# Patient Record
Sex: Female | Born: 1985 | Hispanic: Yes | Marital: Married | Smoking: Never smoker
Health system: Southern US, Community
[De-identification: ages and names within clinical notes are randomized; demographics above are authoritative.]

## PROBLEM LIST (undated history)

## (undated) DIAGNOSIS — C801 Malignant (primary) neoplasm, unspecified: Secondary | ICD-10-CM

## (undated) DIAGNOSIS — D649 Anemia, unspecified: Secondary | ICD-10-CM

## (undated) DIAGNOSIS — J45909 Unspecified asthma, uncomplicated: Secondary | ICD-10-CM

## (undated) HISTORY — DX: Unspecified asthma, uncomplicated: J45.909

## (undated) HISTORY — PX: THYROIDECTOMY: SHX17

## (undated) SURGERY — Surgical Case
Anesthesia: *Unknown

---

## 2015-11-13 ENCOUNTER — Emergency Department (HOSPITAL_COMMUNITY)
Admission: EM | Admit: 2015-11-13 | Discharge: 2015-11-13 | Disposition: A | Discharge status: Home / Self Care | Payer: Self-pay | Attending: Emergency Medicine | Admitting: Emergency Medicine

## 2015-11-13 ENCOUNTER — Encounter (HOSPITAL_COMMUNITY): Payer: Self-pay | Admitting: Emergency Medicine

## 2015-11-13 DIAGNOSIS — Z3202 Encounter for pregnancy test, result negative: Secondary | ICD-10-CM | POA: Insufficient documentation

## 2015-11-13 DIAGNOSIS — N730 Acute parametritis and pelvic cellulitis: Secondary | ICD-10-CM | POA: Insufficient documentation

## 2015-11-13 LAB — URINALYSIS, ROUTINE W REFLEX MICROSCOPIC
BILIRUBIN URINE: NEGATIVE
Glucose, UA: NEGATIVE mg/dL
Hgb urine dipstick: NEGATIVE
KETONES UR: NEGATIVE mg/dL
Leukocytes, UA: NEGATIVE
NITRITE: NEGATIVE
PH: 6.5 (ref 5.0–8.0)
PROTEIN: NEGATIVE mg/dL
Specific Gravity, Urine: 1.027 (ref 1.005–1.030)

## 2015-11-13 LAB — COMPREHENSIVE METABOLIC PANEL
ALK PHOS: 52 U/L (ref 38–126)
ALT: 26 U/L (ref 14–54)
AST: 23 U/L (ref 15–41)
Albumin: 3.9 g/dL (ref 3.5–5.0)
Anion gap: 10 (ref 5–15)
BILIRUBIN TOTAL: 0.5 mg/dL (ref 0.3–1.2)
BUN: 8 mg/dL (ref 6–20)
CALCIUM: 9.3 mg/dL (ref 8.9–10.3)
CO2: 22 mmol/L (ref 22–32)
Chloride: 107 mmol/L (ref 101–111)
Creatinine, Ser: 0.77 mg/dL (ref 0.44–1.00)
GFR calc Af Amer: >60 mL/min (ref >60)
Glucose, Bld: 94 mg/dL (ref 65–99)
POTASSIUM: 4.4 mmol/L (ref 3.5–5.1)
Sodium: 139 mmol/L (ref 135–145)
TOTAL PROTEIN: 7.4 g/dL (ref 6.5–8.1)

## 2015-11-13 LAB — CBC
HEMATOCRIT: 41.8 % (ref 36.0–46.0)
Hemoglobin: 13.6 g/dL (ref 12.0–15.0)
MCH: 29.2 pg (ref 26.0–34.0)
MCHC: 32.5 g/dL (ref 30.0–36.0)
MCV: 89.9 fL (ref 78.0–100.0)
PLATELETS: 240 10*3/uL (ref 150–400)
RBC: 4.65 MIL/uL (ref 3.87–5.11)
RDW: 13.1 % (ref 11.5–15.5)
WBC: 5.3 10*3/uL (ref 4.0–10.5)

## 2015-11-13 LAB — I-STAT TROPONIN, ED: TROPONIN I, POC: 0 ng/mL (ref 0.00–0.08)

## 2015-11-13 LAB — WET PREP, GENITAL
Sperm: NONE SEEN
Trich, Wet Prep: NONE SEEN
YEAST WET PREP: NONE SEEN

## 2015-11-13 LAB — LIPASE, BLOOD: Lipase: 23 U/L (ref 11–51)

## 2015-11-13 LAB — I-STAT BETA HCG BLOOD, ED (MC, WL, AP ONLY): I-stat hCG, quantitative: <5 m[IU]/mL (ref <5)

## 2015-11-13 MED ORDER — DOXYCYCLINE HYCLATE 50 MG PO CAPS: 50.0000 mg | ORAL_CAPSULE | Freq: Two times a day (BID) | ORAL | Status: DC

## 2015-11-13 MED ORDER — IBUPROFEN 400 MG PO TABS: 400.0000 mg | ORAL_TABLET | Freq: Four times a day (QID) | ORAL | Status: DC | PRN

## 2015-11-13 MED ORDER — CEFTRIAXONE SODIUM 250 MG IJ SOLR
250.0000 mg | Freq: Once | INTRAMUSCULAR | Status: AC
  Administered 2015-11-13: 250 mg via INTRAMUSCULAR
  Filled 2015-11-13: qty 250

## 2015-11-13 MED ORDER — AZITHROMYCIN 250 MG PO TABS
1000.0000 mg | ORAL_TABLET | Freq: Once | ORAL | Status: AC
  Administered 2015-11-13: 1000 mg via ORAL
  Filled 2015-11-13: qty 4

## 2015-11-13 NOTE — Discharge Instructions (Signed)
Enfermedad pélvica inflamatoria °(Pelvic Inflammatory Disease) °El término enfermedad pélvica inflamatoria (EPI) hace referencia a una infección en algunos órganos sexuales femeninos o en todos ellos. La infección se puede producir en el útero, los ovarios, las trompas de Falopio o los tejidos circundantes de la pelvis. La enfermedad pélvica inflamatoria puede causar dolor en la pelvis o en el abdomen que aparece de manera repentina (dolor pélvico agudo). La EPI es una infección grave porque puede derivar en dolor pélvico prolongado (crónico) o en la imposibilidad de tener hijos (esterilidad). °CAUSAS °La causa más frecuente de esta enfermedad es una infección que se disemina durante el contacto sexual. Sin embargo, la infección también puede deberse a las bacterias normales que se encuentran en los tejidos vaginales, si estas ascienden hasta los órganos reproductivos. La EPI también puede presentarse después de lo siguiente: °· El nacimiento de un bebé. °· Un aborto espontáneo. °· Un aborto. °· Cirugía pélvica mayor. °· El uso de un dispositivo intrauterino (DIU). °· Una agresión sexual. °FACTORES DE RIESGO °Esta afección es más probable en las mujeres que tienen estas características: °· Son menores de 25 años. °· Son activas sexualmente a una edad temprana. °· Usan métodos anticonceptivos que no son de barrera. °· Tienen muchos compañeros sexuales. °· Tienen relaciones sexuales con una persona que presenta síntomas de una ETS (enfermedad de transmisión sexual). °· Toman anticonceptivos. °En ocasiones, determinadas conductas también pueden incrementar la posibilidad de tener EPI, por ejemplo: °· Usar duchas vaginales. °· Tener un DIU colocado. °SÍNTOMAS °Los síntomas de esta afección incluyen lo siguiente: °· Dolor pélvico o abdominal. °· Fiebre. °· Escalofríos. °· Flujo vaginal anormal. °· Sangrado uterino anormal. °· Dolor atípico poco después de la finalización de la menstruación. °· Dolor al orinar. °· Dolor  durante las relaciones sexuales. °· Náuseas y vómitos. °DIAGNÓSTICO °Para diagnosticar esta afección, el médico le hará un examen físico y una historia clínica. Generalmente, el examen pélvico revela un gran dolor con la palpación del útero y de los tejidos pélvicos circundantes. También pueden hacerle estudios, por ejemplo: °· Análisis de laboratorio, entre ellos, una prueba de embarazo, análisis de sangre y de orina. °· Pruebas de cultivo de la vagina y del cuello del útero, a fin de detectar la presencia de una ETS. °· Ecografía. °· Un procedimiento laparoscópico para examinar el interior de la pelvis. °· Examen de las secreciones vaginales con un microscopio. °TRATAMIENTO °El tratamiento de esta afección puede tener uno o más abordajes. °· Se pueden recetar antibióticos por vía oral. °· Es posible que haya que tratar a los compañeros sexuales si el factor causante de la infección es una ETS. °· Cuando los casos revisten mayor gravedad, es posible que haya que hospitalizar a la paciente para administrarle los antibióticos directamente en una vena a través de una vía intravenosa (IV). °· Puede ser necesario hacer una cirugía si otros tratamientos no son eficaces, aunque esto es poco frecuente. °Pueden pasar semanas hasta que se haya recuperado por completo. Si le diagnostican EPI, también deben hacerle estudios de detección del virus de inmunodeficiencia humana (VIH). El médico puede repetirle las pruebas de detección de infecciones 3 meses después del tratamiento. No debe mantener relaciones sexuales sin protección. °INSTRUCCIONES PARA EL CUIDADO EN EL HOGAR °· Tome los medicamentos de venta libre y los recetados solamente como se lo haya indicado el médico. °· Si le recetaron un antibiótico, tómelo como se lo haya indicado el médico. No deje de tomar los antibióticos aunque comience a sentirse mejor. °· No tenga relaciones   sexuales hasta completar el tratamiento, o como se lo haya indicado el médico. Si se  confirma la presencia de EPI, sus compañeros sexuales recientes deberán recibir tratamiento, especialmente si las relaciones sexuales fueron sin protección. °· Concurra a todas las visitas de control como se lo haya indicado el médico. Esto es importante. °SOLICITE ATENCIÓN MÉDICA SI: °· Aumenta el flujo vaginal o este no es normal. °· El dolor no mejora. °· Vomita. °· Tiene fiebre. °· No puede tolerar los medicamentos. °· Su pareja tiene una ETS. °· Siente dolor al orinar. °SOLICITE ATENCIÓN MÉDICA DE INMEDIATO SI: °· Aumenta el dolor abdominal o pélvico. °· Tiene escalofríos. °· Los síntomas no mejoran después de 72 horas, incluso con el tratamiento. °  °Esta información no tiene como fin reemplazar el consejo del médico. Asegúrese de hacerle al médico cualquier pregunta que tenga. °  °Document Released: 04/24/2005 Document Revised: 04/05/2015 °Elsevier Interactive Patient Education ©2016 [employer] ° °

## 2015-11-13 NOTE — ED Notes (Signed)
MD at bedside. 

## 2015-11-13 NOTE — ED Provider Notes (Signed)
CSN: [id number]     Arrival date & time 11/13/15  1242 History   First MD Initiated Contact with Patient 11/13/15 1902     Chief Complaint  Patient presents with  . Abdominal Pain     (Consider location/radiation/quality/duration/timing/severity/associated sxs/prior Treatment) HPI Comments: Pt comes in with cc of lower abd pain. Pt's friend translated for her, with the consent of the patient. Pt reports 2 days aof abd pain with yellow vaginal discharge. Pt has had nausea. No emesis. Pain is suprapubic. Pt has no hx of abd surgery, STD. She has some urinary discomfort, but no dysuria, polyuria, hematuria. No hx of pain like this before.    Patient is a 30 y.o. female presenting with abdominal pain. The history is provided by the patient.  Abdominal Pain Associated symptoms: nausea, vaginal discharge and vomiting   Associated symptoms: no chest pain, no dysuria and no shortness of breath     History reviewed. No pertinent past medical history. History reviewed. No pertinent past surgical history. No family history on file. Social History  Substance Use Topics  . Smoking status: Never Smoker   . Smokeless tobacco: None  . Alcohol Use: No   OB History    No data available     Review of Systems  Constitutional: Negative for activity change.  Respiratory: Negative for shortness of breath.   Cardiovascular: Negative for chest pain.  Gastrointestinal: Positive for nausea, vomiting and abdominal pain.  Genitourinary: Positive for vaginal discharge and pelvic pain. Negative for dysuria.  Musculoskeletal: Negative for neck pain.  Neurological: Negative for headaches.      Allergies  Review of patient's allergies indicates no known allergies.  Home Medications   Prior to Admission medications   Medication Sig Start Date End Date Taking? Authorizing Provider  doxycycline (VIBRAMYCIN) 50 MG capsule Take 1 capsule (50 mg total) by mouth 2 (two) times daily. 11/13/15   Varney Biles, MD  ibuprofen (ADVIL,MOTRIN) 400 MG tablet Take 1 tablet (400 mg total) by mouth every 6 (six) hours as needed. 11/13/15   Donna Snooks Kathrynn Humble, MD   BP 127/77 mmHg  Pulse 84  Temp(Src) 98.1 F (36.7 C) (Oral)  Resp 18  Ht 5\' 4"  (1.626 m)  Wt 168 lb (76.204 kg)  BMI 28.82 kg/m2  SpO2 100%  LMP 10/28/2015 (Exact Date) Physical Exam  Constitutional: She is oriented to person, place, and time. She appears well-developed.  HENT:  Head: Normocephalic and atraumatic.  Eyes: Conjunctivae and EOM are normal. Pupils are equal, round, and reactive to light.  Neck: Normal range of motion. Neck supple.  Cardiovascular: Normal rate, regular rhythm, normal heart sounds and intact distal pulses.   No murmur heard. Pulmonary/Chest: Effort normal. No respiratory distress. She has no wheezes.  Abdominal: Soft. Bowel sounds are normal. She exhibits no distension. There is no tenderness. There is no rebound and no guarding.  Genitourinary: Vagina normal and uterus normal.  External exam - normal, no lesions Speculum exam: Pt has yellow discharge, no blood Bimanual exam: Patient has CMT, no adnexal tenderness or fullness and cervical os is closed  Neurological: She is alert and oriented to person, place, and time.  Skin: Skin is warm and dry.  Nursing note and vitals reviewed.   ED Course  Procedures (including critical care time) Labs Review Labs Reviewed  WET PREP, GENITAL - Abnormal; Notable for the following:    Clue Cells Wet Prep HPF POC PRESENT (*)    WBC, Wet Prep HPF POC  MANY (*)    All other components within normal limits  URINALYSIS, ROUTINE W REFLEX MICROSCOPIC (NOT AT [facility]) - Abnormal; Notable for the following:    Color, Urine AMBER (*)    All other components within normal limits  LIPASE, BLOOD  COMPREHENSIVE METABOLIC PANEL  CBC  I-STAT TROPOININ, ED  I-STAT BETA HCG BLOOD, ED ([facility], [facility], [facility] ONLY)  GC/CHLAMYDIA PROBE AMP ([place]) NOT AT [facility]    Imaging Review No  results found. I have personally reviewed and evaluated these images and lab results as part of my medical decision-making.   EKG Interpretation None      MDM   Final diagnoses:  PID (acute pelvic inflammatory disease)   Pt comes in with lower quadrant abd pain, suprapubic region and she has CMT. Will tx as PID. No focal RLQ tenderness, labs reassuring, UA is clean.    Varney Biles, MD 11/14/15 [phone]

## 2015-11-13 NOTE — ED Notes (Addendum)
abd pain since yesterday , nausea and vomiting-- had vaginal bleeding last week.

## 2015-11-14 LAB — GC/CHLAMYDIA PROBE AMP (~~LOC~~) NOT AT ARMC
Chlamydia: NEGATIVE
NEISSERIA GONORRHEA: NEGATIVE

## 2016-12-13 ENCOUNTER — Other Ambulatory Visit: Payer: Self-pay | Admitting: Otolaryngology

## 2017-01-10 ENCOUNTER — Encounter (HOSPITAL_COMMUNITY)
Admission: RE | Admit: 2017-01-10 | Discharge: 2017-01-10 | Disposition: A | Discharge status: Home / Self Care | Payer: Self-pay | Source: Ambulatory Visit | Attending: Otolaryngology | Admitting: Otolaryngology

## 2017-01-10 ENCOUNTER — Encounter (HOSPITAL_COMMUNITY): Payer: Self-pay | Admitting: *Deleted

## 2017-01-10 ENCOUNTER — Ambulatory Visit (HOSPITAL_COMMUNITY)
Admission: RE | Admit: 2017-01-10 | Discharge: 2017-01-10 | Disposition: A | Discharge status: Home / Self Care | Payer: Self-pay | Source: Ambulatory Visit | Attending: Anesthesiology | Admitting: Anesthesiology

## 2017-01-10 DIAGNOSIS — Z01818 Encounter for other preprocedural examination: Secondary | ICD-10-CM

## 2017-01-10 DIAGNOSIS — C73 Malignant neoplasm of thyroid gland: Secondary | ICD-10-CM | POA: Insufficient documentation

## 2017-01-10 LAB — CBC
HCT: 41 % (ref 36.0–46.0)
Hemoglobin: 13.4 g/dL (ref 12.0–15.0)
MCH: 29.3 pg (ref 26.0–34.0)
MCHC: 32.7 g/dL (ref 30.0–36.0)
MCV: 89.7 fL (ref 78.0–100.0)
PLATELETS: 242 10*3/uL (ref 150–400)
RBC: 4.57 MIL/uL (ref 3.87–5.11)
RDW: 12.5 % (ref 11.5–15.5)
WBC: 6 10*3/uL (ref 4.0–10.5)

## 2017-01-10 LAB — HCG, SERUM, QUALITATIVE: PREG SERUM: NEGATIVE

## 2017-01-10 IMAGING — CR DG CHEST 2V
2 series · 2 of 2 positions shown · non-contrast
Comparison: None.

CLINICAL DATA: Preoperative exam for upcoming thyroidectomy.

EXAM:
CHEST  2 VIEW

[w chest pa]
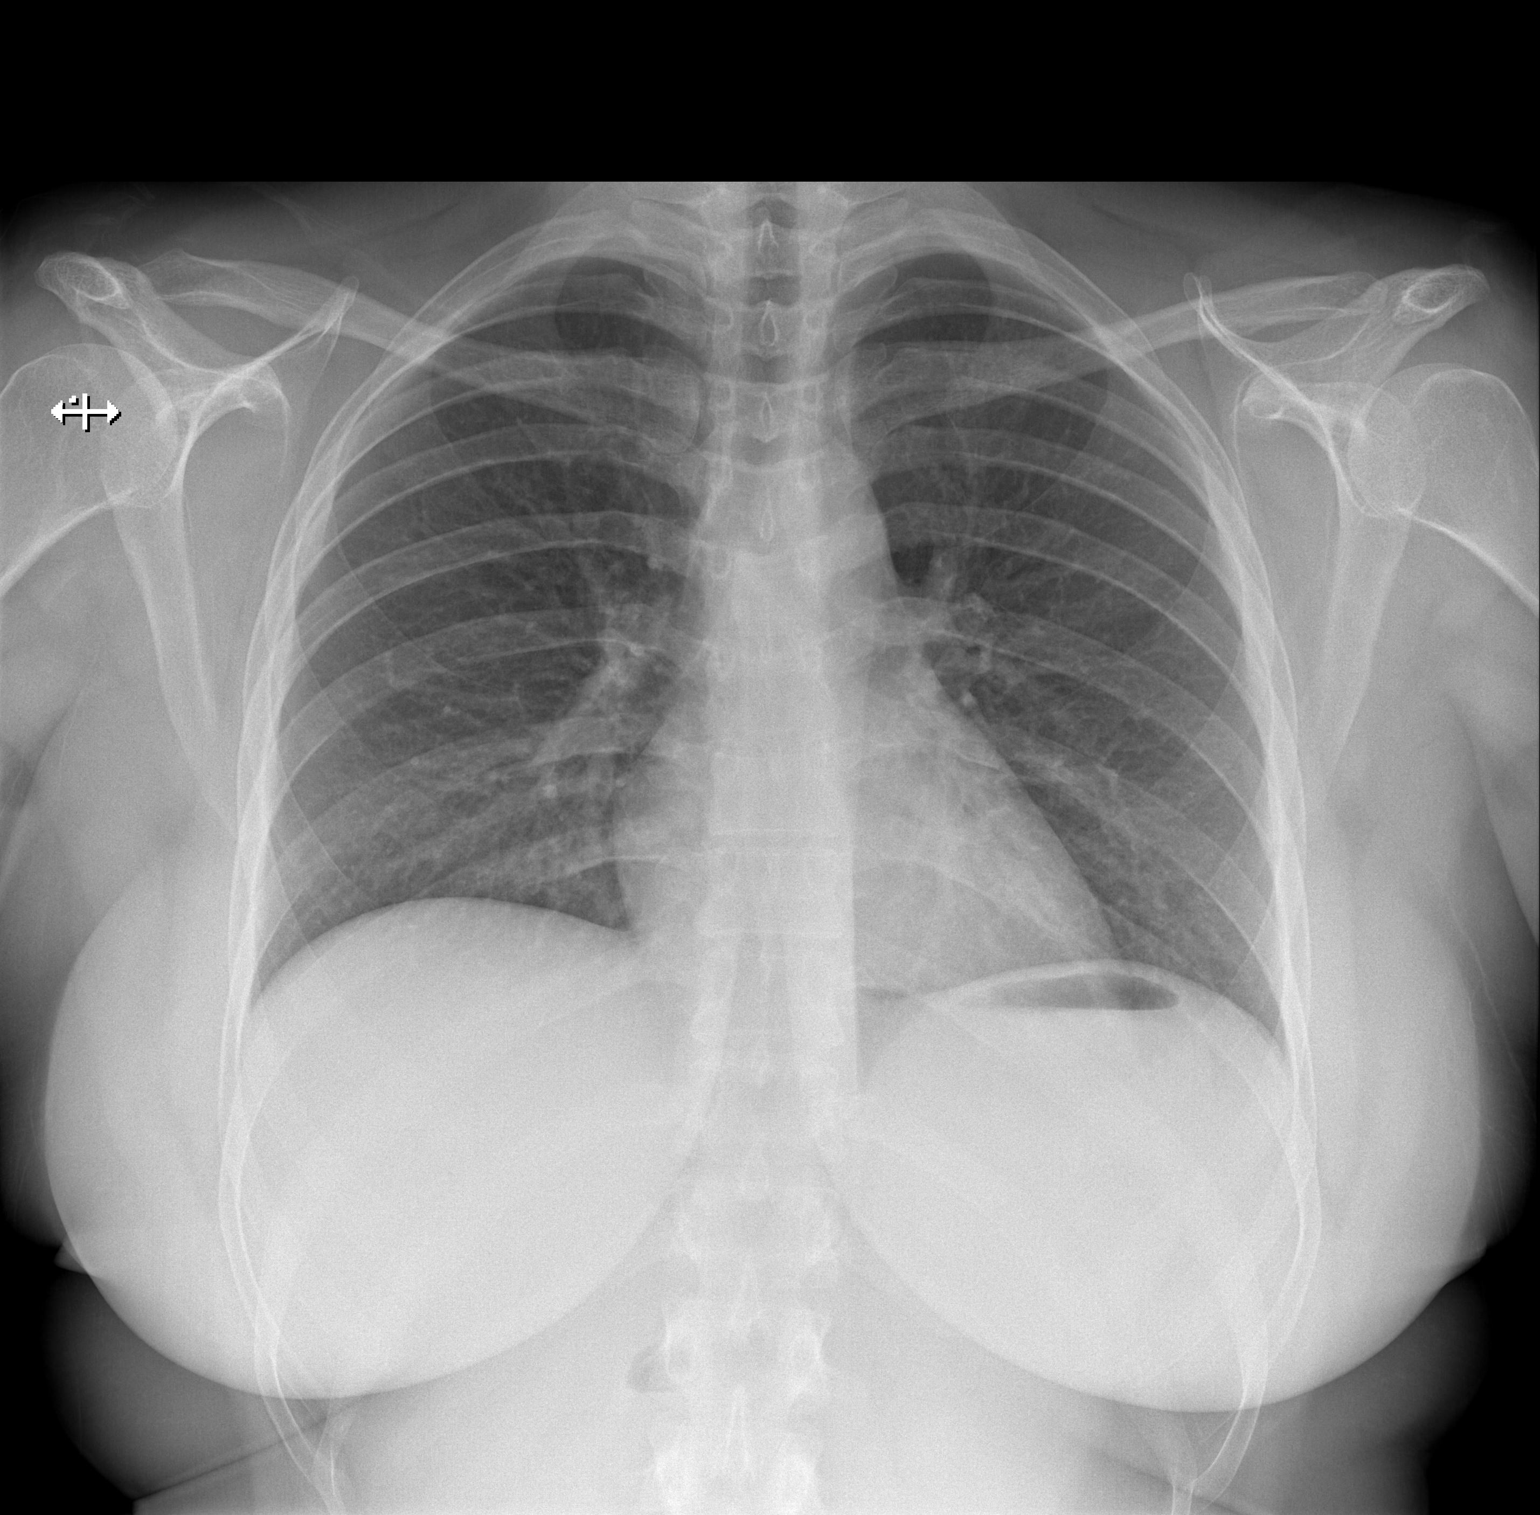

[w chest lat]
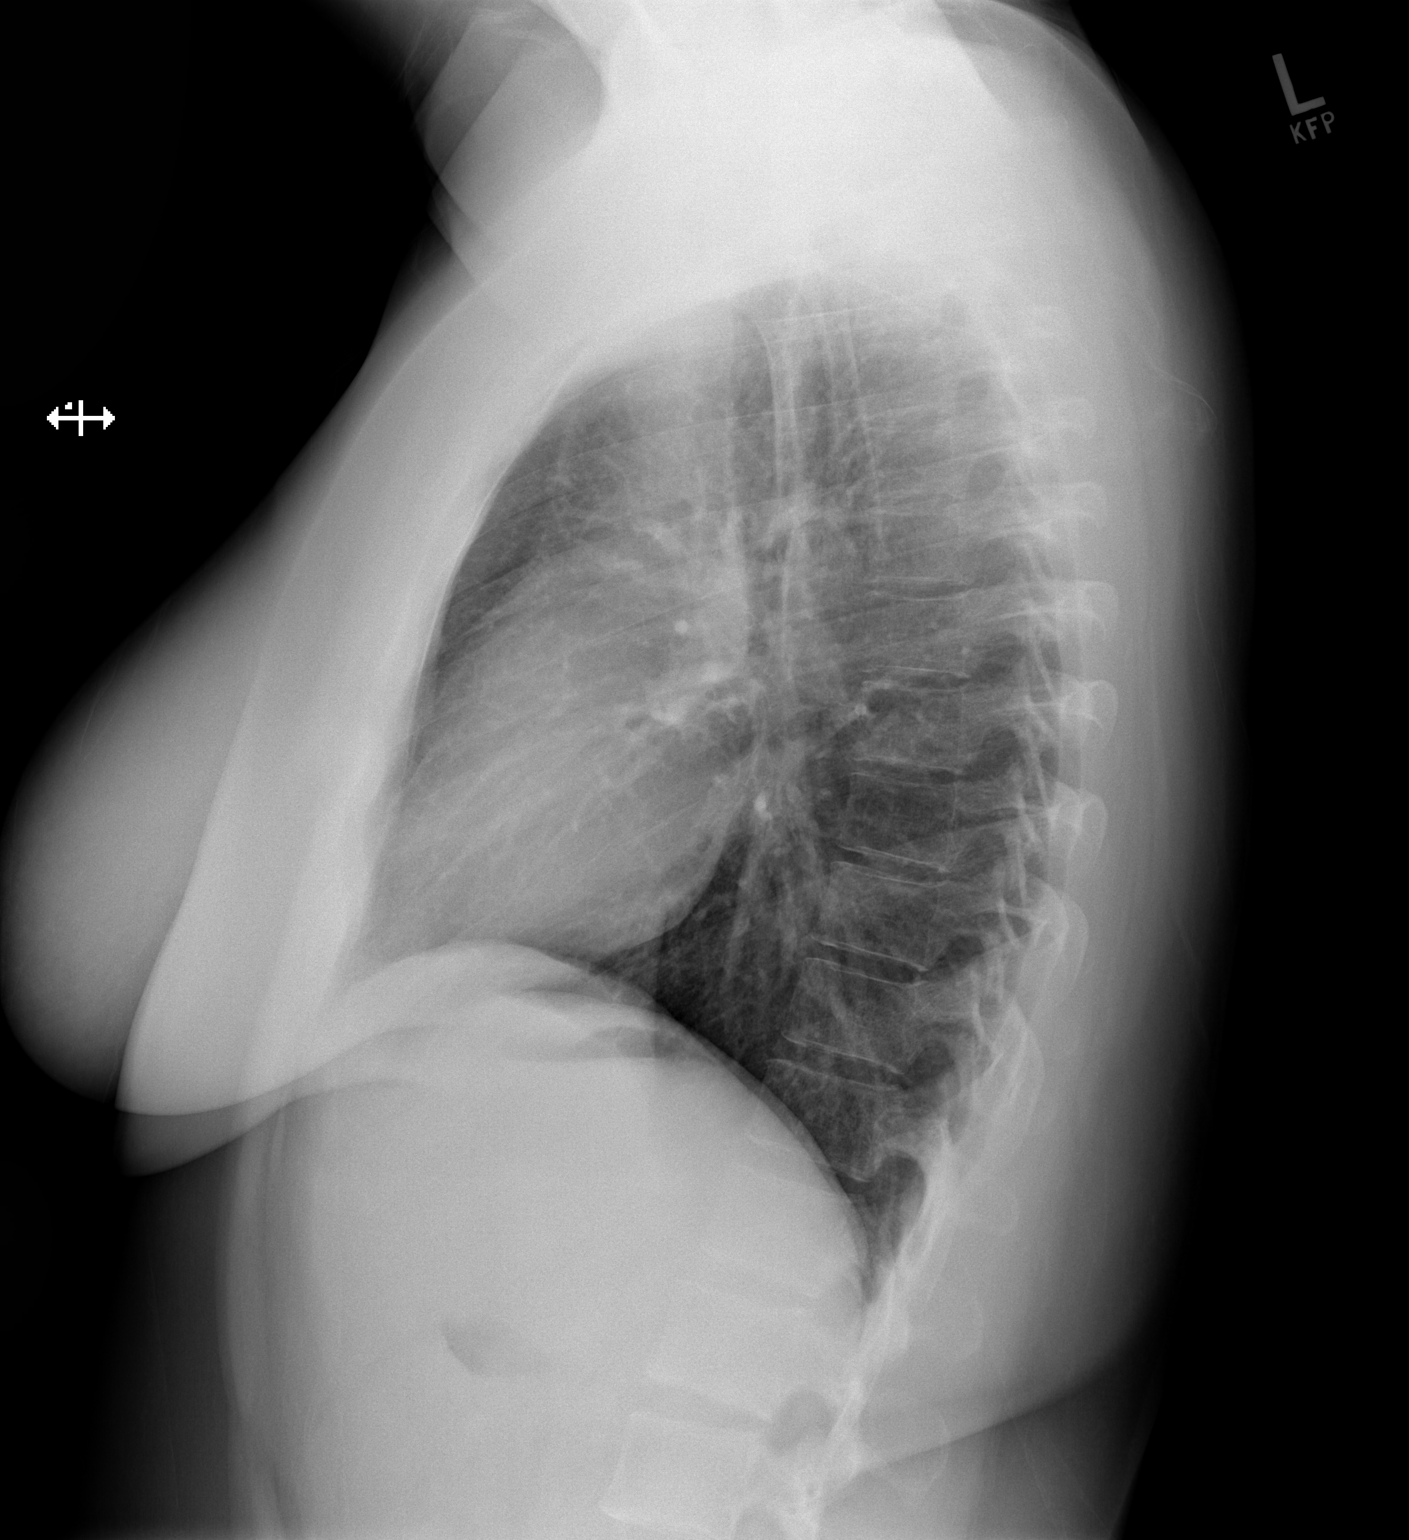

[2 of 2 positions shown; findings below may reference images not displayed]

FINDINGS: The heart size and mediastinal contours are within normal limits.
Both lungs are clear. The visualized skeletal structures are
unremarkable.
IMPRESSION: No active cardiopulmonary disease.

## 2017-01-10 NOTE — Pre-Procedure Instructions (Addendum)
[facility]  01/10/2017      [place], [address] [place] [address] Phone: [phone] Fax: [phone]    Your procedure is scheduled on 01/17/17  Report to [facility] Admitting at 530 A.M.  Call this number if you have problems the morning of surgery:  [phone]   Remember:  Do not eat food or drink liquids after midnight.  Take these medicines the morning of surgery with A SIP OF WATER    tylenol  STOP all herbel meds, nsaids (aleve,naproxen,advil,ibuprofen)   prior to surgery starting today 01/10/17 including all vitamins/supplements,aspirin   Do not wear jewelry, make-up or nail polish.  Do not wear lotions, powders, or perfumes, or deoderant.  Do not shave 48 hours prior to surgery.  Men may shave face and neck.  Do not bring valuables to the hospital.  [facility] is not responsible for any belongings or valuables.  Contacts, dentures or bridgework may not be worn into surgery.  Leave your suitcase in the car.  After surgery it may be brought to your room.  For patients admitted to the hospital, discharge time will be determined by your treatment team.  Patients discharged the day of surgery will not be allowed to drive home.   Special instructions:   Special Instructions: [facility] - Preparing for Surgery  Before surgery, you can play an important role.  Because skin is not sterile, your skin needs to be as free of germs as possible.  You can reduce the number of germs on you skin by washing with CHG (chlorahexidine gluconate) soap before surgery.  CHG is an antiseptic cleaner which kills germs and bonds with the skin to continue killing germs even after washing.  Please DO NOT use if you have an allergy to CHG or antibacterial soaps.  If your skin becomes reddened/irritated stop using the CHG and inform your nurse when you arrive at Short Stay.  Do not shave (including legs  and underarms) for at least 48 hours prior to the first CHG shower.  You may shave your face.  Please follow these instructions carefully:   1.  Shower with CHG Soap the night before surgery and the morning of Surgery.  2.  If you choose to wash your hair, wash your hair first as usual with your normal shampoo.  3.  After you shampoo, rinse your hair and body thoroughly to remove the Shampoo.  4.  Use CHG as you would any other liquid soap.  You can apply chg directly  to the skin and wash gently with scrungie or a clean washcloth.  5.  Apply the CHG Soap to your body ONLY FROM THE NECK DOWN.  Do not use on open wounds or open sores.  Avoid contact with your eyes ears, mouth and genitals (private parts).  Wash genitals (private parts)       with your normal soap.  6.  Wash thoroughly, paying special attention to the area where your surgery will be performed.  7.  Thoroughly rinse your body with warm water from the neck down.  8.  DO NOT shower/wash with your normal soap after using and rinsing off the CHG Soap.  9.  Pat yourself dry with a clean towel.            10.  Wear clean pajamas.            11.  Place clean sheets on your bed the night of your first shower and do not sleep with pets.  Day of Surgery  Do not apply any lotions/deodorants the morning of surgery.  Please wear clean clothes to the hospital/surgery center.  Please read over the  fact sheets that you were given.

## 2017-01-17 ENCOUNTER — Ambulatory Visit (HOSPITAL_COMMUNITY): Payer: Self-pay | Admitting: Certified Registered Nurse Anesthetist

## 2017-01-17 ENCOUNTER — Encounter (HOSPITAL_COMMUNITY)
Admission: RE | Disposition: A | Discharge status: Home / Self Care | Payer: Self-pay | Source: Ambulatory Visit | Attending: Otolaryngology

## 2017-01-17 ENCOUNTER — Encounter (HOSPITAL_COMMUNITY): Payer: Self-pay | Admitting: *Deleted

## 2017-01-17 ENCOUNTER — Observation Stay (HOSPITAL_COMMUNITY)
Admission: RE | Admit: 2017-01-17 | Discharge: 2017-01-18 | Disposition: A | Discharge status: Home / Self Care | Payer: Self-pay | Source: Ambulatory Visit | Attending: Otolaryngology | Admitting: Otolaryngology

## 2017-01-17 DIAGNOSIS — E890 Postprocedural hypothyroidism: Secondary | ICD-10-CM

## 2017-01-17 DIAGNOSIS — C801 Malignant (primary) neoplasm, unspecified: Secondary | ICD-10-CM | POA: Diagnosis present

## 2017-01-17 DIAGNOSIS — C73 Malignant neoplasm of thyroid gland: Principal | ICD-10-CM | POA: Insufficient documentation

## 2017-01-17 HISTORY — PX: THYROIDECTOMY: SHX17

## 2017-01-17 HISTORY — DX: Malignant (primary) neoplasm, unspecified: C80.1

## 2017-01-17 HISTORY — DX: Postprocedural hypothyroidism: E89.0

## 2017-01-17 SURGERY — THYROIDECTOMY
Anesthesia: General | Laterality: Right

## 2017-01-17 MED ORDER — FENTANYL CITRATE (PF) 250 MCG/5ML IJ SOLN
INTRAMUSCULAR | Status: AC
  Filled 2017-01-17: qty 5

## 2017-01-17 MED ORDER — ROCURONIUM BROMIDE 10 MG/ML (PF) SYRINGE
PREFILLED_SYRINGE | INTRAVENOUS | Status: AC
  Filled 2017-01-17: qty 5

## 2017-01-17 MED ORDER — CEFAZOLIN SODIUM-DEXTROSE 2-3 GM-% IV SOLR
INTRAVENOUS | Status: DC | PRN
  Administered 2017-01-17: 2 g via INTRAVENOUS

## 2017-01-17 MED ORDER — FENTANYL CITRATE (PF) 100 MCG/2ML IJ SOLN
INTRAMUSCULAR | Status: AC
  Administered 2017-01-17: 50 ug via INTRAVENOUS
  Filled 2017-01-17: qty 2

## 2017-01-17 MED ORDER — LIDOCAINE 2% (20 MG/ML) 5 ML SYRINGE
INTRAMUSCULAR | Status: AC
  Filled 2017-01-17: qty 5

## 2017-01-17 MED ORDER — HYDROCODONE-ACETAMINOPHEN 5-325 MG PO TABS
1.0000 | ORAL_TABLET | ORAL | Status: DC | PRN
  Administered 2017-01-17: 2 via ORAL
  Administered 2017-01-17: 1 via ORAL
  Administered 2017-01-17 – 2017-01-18 (×2): 2 via ORAL
  Filled 2017-01-17: qty 1
  Filled 2017-01-17 (×3): qty 2

## 2017-01-17 MED ORDER — PHENYLEPHRINE 40 MCG/ML (10ML) SYRINGE FOR IV PUSH (FOR BLOOD PRESSURE SUPPORT)
PREFILLED_SYRINGE | INTRAVENOUS | Status: AC
  Filled 2017-01-17: qty 10

## 2017-01-17 MED ORDER — DEXAMETHASONE SODIUM PHOSPHATE 10 MG/ML IJ SOLN
INTRAMUSCULAR | Status: DC | PRN
  Administered 2017-01-17: 10 mg via INTRAVENOUS

## 2017-01-17 MED ORDER — OXYCODONE HCL 5 MG PO TABS: 5.0000 mg | ORAL_TABLET | Freq: Once | ORAL | Status: DC | PRN

## 2017-01-17 MED ORDER — CEFAZOLIN SODIUM-DEXTROSE 2-4 GM/100ML-% IV SOLN
INTRAVENOUS | Status: AC
  Filled 2017-01-17: qty 100

## 2017-01-17 MED ORDER — ONDANSETRON HCL 4 MG PO TABS
4.0000 mg | ORAL_TABLET | ORAL | Status: DC | PRN
  Filled 2017-01-17: qty 1

## 2017-01-17 MED ORDER — CHLORHEXIDINE GLUCONATE CLOTH 2 % EX PADS: 6.0000 | MEDICATED_PAD | Freq: Once | CUTANEOUS | Status: DC

## 2017-01-17 MED ORDER — LIDOCAINE-EPINEPHRINE 1 %-1:100000 IJ SOLN
INTRAMUSCULAR | Status: AC
  Filled 2017-01-17: qty 1

## 2017-01-17 MED ORDER — SUCCINYLCHOLINE CHLORIDE 20 MG/ML IJ SOLN
INTRAMUSCULAR | Status: DC | PRN
  Administered 2017-01-17: 60 mg via INTRAVENOUS

## 2017-01-17 MED ORDER — DEXTROSE 5 % IV SOLN
INTRAVENOUS | Status: AC
  Filled 2017-01-17: qty 1.5

## 2017-01-17 MED ORDER — MORPHINE SULFATE (PF) 2 MG/ML IV SOLN: 2.0000 mg | INTRAVENOUS | Status: DC | PRN

## 2017-01-17 MED ORDER — PROPOFOL 10 MG/ML IV BOLUS
INTRAVENOUS | Status: DC | PRN
Start: 2017-01-17 — End: 2017-01-17
  Administered 2017-01-17: 160 mg via INTRAVENOUS
  Administered 2017-01-17: 40 mg via INTRAVENOUS

## 2017-01-17 MED ORDER — EPHEDRINE 5 MG/ML INJ
INTRAVENOUS | Status: AC
  Filled 2017-01-17: qty 10

## 2017-01-17 MED ORDER — ONDANSETRON HCL 4 MG/2ML IJ SOLN
INTRAMUSCULAR | Status: AC
  Filled 2017-01-17: qty 2

## 2017-01-17 MED ORDER — MIDAZOLAM HCL 5 MG/5ML IJ SOLN
INTRAMUSCULAR | Status: DC | PRN
  Administered 2017-01-17: 2 mg via INTRAVENOUS

## 2017-01-17 MED ORDER — CALCIUM CARBONATE ANTACID 500 MG PO CHEW
2.0000 | CHEWABLE_TABLET | Freq: Three times a day (TID) | ORAL | Status: DC
  Administered 2017-01-17 – 2017-01-18 (×4): 400 mg via ORAL
  Filled 2017-01-17 (×4): qty 2

## 2017-01-17 MED ORDER — FENTANYL CITRATE (PF) 100 MCG/2ML IJ SOLN
INTRAMUSCULAR | Status: DC | PRN
Start: 2017-01-17 — End: 2017-01-17
  Administered 2017-01-17 (×2): 50 ug via INTRAVENOUS
  Administered 2017-01-17: 100 ug via INTRAVENOUS

## 2017-01-17 MED ORDER — LACTATED RINGERS IV SOLN
INTRAVENOUS | Status: DC | PRN
  Administered 2017-01-17 (×2): via INTRAVENOUS

## 2017-01-17 MED ORDER — FENTANYL CITRATE (PF) 100 MCG/2ML IJ SOLN
25.0000 ug | INTRAMUSCULAR | Status: DC | PRN
  Administered 2017-01-17 (×2): 50 ug via INTRAVENOUS

## 2017-01-17 MED ORDER — 0.9 % SODIUM CHLORIDE (POUR BTL) OPTIME
TOPICAL | Status: DC | PRN
  Administered 2017-01-17: 1000 mL

## 2017-01-17 MED ORDER — ONDANSETRON HCL 4 MG/2ML IJ SOLN
INTRAMUSCULAR | Status: DC | PRN
  Administered 2017-01-17: 4 mg via INTRAVENOUS

## 2017-01-17 MED ORDER — OXYCODONE HCL 5 MG/5ML PO SOLN: 5.0000 mg | Freq: Once | ORAL | Status: DC | PRN

## 2017-01-17 MED ORDER — SUCCINYLCHOLINE CHLORIDE 200 MG/10ML IV SOSY
PREFILLED_SYRINGE | INTRAVENOUS | Status: AC
  Filled 2017-01-17: qty 10

## 2017-01-17 MED ORDER — MIDAZOLAM HCL 2 MG/2ML IJ SOLN
INTRAMUSCULAR | Status: AC
  Filled 2017-01-17: qty 2

## 2017-01-17 MED ORDER — LIDOCAINE-EPINEPHRINE 1 %-1:100000 IJ SOLN
INTRAMUSCULAR | Status: DC | PRN
  Administered 2017-01-17: 3 mL

## 2017-01-17 MED ORDER — HYDROCODONE-ACETAMINOPHEN 5-325 MG PO TABS
ORAL_TABLET | ORAL | Status: AC
  Filled 2017-01-17: qty 2

## 2017-01-17 MED ORDER — DEXAMETHASONE SODIUM PHOSPHATE 10 MG/ML IJ SOLN
INTRAMUSCULAR | Status: AC
  Filled 2017-01-17: qty 1

## 2017-01-17 MED ORDER — HEMOSTATIC AGENTS (NO CHARGE) OPTIME
TOPICAL | Status: DC | PRN
  Administered 2017-01-17: 1 via TOPICAL

## 2017-01-17 MED ORDER — KCL IN DEXTROSE-NACL 20-5-0.45 MEQ/L-%-% IV SOLN
INTRAVENOUS | Status: DC
  Administered 2017-01-17: 12:00:00 via INTRAVENOUS
  Filled 2017-01-17: qty 1000

## 2017-01-17 MED ORDER — ONDANSETRON HCL 4 MG/2ML IJ SOLN
4.0000 mg | INTRAMUSCULAR | Status: DC | PRN
  Filled 2017-01-17: qty 2

## 2017-01-17 MED ORDER — LEVOTHYROXINE SODIUM 75 MCG PO TABS
150.0000 ug | ORAL_TABLET | Freq: Every day | ORAL | Status: DC
  Administered 2017-01-18: 150 ug via ORAL
  Filled 2017-01-17: qty 2

## 2017-01-17 MED ORDER — PROPOFOL 10 MG/ML IV BOLUS
INTRAVENOUS | Status: AC
  Filled 2017-01-17: qty 40

## 2017-01-17 MED ORDER — CEFAZOLIN SODIUM-DEXTROSE 1-4 GM/50ML-% IV SOLN
1.0000 g | Freq: Three times a day (TID) | INTRAVENOUS | Status: AC
  Administered 2017-01-17 – 2017-01-18 (×3): 1 g via INTRAVENOUS
  Filled 2017-01-17 (×4): qty 50

## 2017-01-17 SURGICAL SUPPLY — 55 items
BLADE SURG 15 STRL LF DISP TIS (BLADE) IMPLANT
BLADE SURG 15 STRL SS (BLADE)
CANISTER SUCT 3000ML PPV (MISCELLANEOUS) ×2 IMPLANT
CLEANER TIP ELECTROSURG 2X2 (MISCELLANEOUS) ×2 IMPLANT
CONT SPEC 4OZ CLIKSEAL STRL BL (MISCELLANEOUS) IMPLANT
CORDS BIPOLAR (ELECTRODE) ×2 IMPLANT
COVER SURGICAL LIGHT HANDLE (MISCELLANEOUS) ×2 IMPLANT
CRADLE DONUT ADULT HEAD (MISCELLANEOUS) IMPLANT
DERMABOND ADVANCED (GAUZE/BANDAGES/DRESSINGS) ×1
DERMABOND ADVANCED .7 DNX12 (GAUZE/BANDAGES/DRESSINGS) ×1 IMPLANT
DRAIN JACKSON RD 7FR 3/32 (WOUND CARE) IMPLANT
DRAIN SNY 10 ROU (WOUND CARE) IMPLANT
DRAPE HALF SHEET 40X57 (DRAPES) IMPLANT
ELECT COATED BLADE 2.86 ST (ELECTRODE) ×2 IMPLANT
ELECT REM PT RETURN 9FT ADLT (ELECTROSURGICAL) ×2
ELECTRODE REM PT RTRN 9FT ADLT (ELECTROSURGICAL) ×1 IMPLANT
EVACUATOR SILICONE 100CC (DRAIN) ×2 IMPLANT
FORCEPS BIPOLAR SPETZLER 8 1.0 (NEUROSURGERY SUPPLIES) ×2 IMPLANT
GAUZE SPONGE 4X4 16PLY XRAY LF (GAUZE/BANDAGES/DRESSINGS) ×4 IMPLANT
GLOVE BIO SURGEON STRL SZ7.5 (GLOVE) ×4 IMPLANT
GLOVE BIOGEL PI IND STRL 6.5 (GLOVE) ×3 IMPLANT
GLOVE BIOGEL PI IND STRL 7.0 (GLOVE) ×3 IMPLANT
GLOVE BIOGEL PI IND STRL 7.5 (GLOVE) ×1 IMPLANT
GLOVE BIOGEL PI INDICATOR 6.5 (GLOVE) ×3
GLOVE BIOGEL PI INDICATOR 7.0 (GLOVE) ×3
GLOVE BIOGEL PI INDICATOR 7.5 (GLOVE) ×1
GLOVE ECLIPSE 7.5 STRL STRAW (GLOVE) ×2 IMPLANT
GLOVE INDICATOR 6.5 STRL GRN (GLOVE) ×2 IMPLANT
GLOVE INDICATOR 7.0 STRL GRN (GLOVE) ×4 IMPLANT
GLOVE SURG SS PI 6.0 STRL IVOR (GLOVE) ×4 IMPLANT
GOWN STRL REUS W/ TWL LRG LVL3 (GOWN DISPOSABLE) ×4 IMPLANT
GOWN STRL REUS W/TWL LRG LVL3 (GOWN DISPOSABLE) ×4
HEMOSTAT SURGICEL 2X14 (HEMOSTASIS) ×2 IMPLANT
KIT BASIN OR (CUSTOM PROCEDURE TRAY) ×2 IMPLANT
KIT ROOM TURNOVER OR (KITS) ×2 IMPLANT
LOCATOR NERVE 3 VOLT (DISPOSABLE) IMPLANT
NEEDLE HYPO 25GX1X1/2 BEV (NEEDLE) ×2 IMPLANT
NS IRRIG 1000ML POUR BTL (IV SOLUTION) ×2 IMPLANT
PAD ARMBOARD 7.5X6 YLW CONV (MISCELLANEOUS) ×4 IMPLANT
PENCIL BUTTON HOLSTER BLD 10FT (ELECTRODE) ×2 IMPLANT
PROBE NERVBE PRASS .33 (MISCELLANEOUS) ×2 IMPLANT
SHEARS HARMONIC 9CM CVD (BLADE) ×2 IMPLANT
SPONGE INTESTINAL PEANUT (DISPOSABLE) ×2 IMPLANT
STAPLER VISISTAT 35W (STAPLE) ×2 IMPLANT
SUT ETHILON 2 0 FS 18 (SUTURE) ×2 IMPLANT
SUT SILK 3 0 REEL (SUTURE) ×2 IMPLANT
SUT SILK 4 0 RB 1 (SUTURE) ×2 IMPLANT
SUT VIC AB 3-0 SH 27 (SUTURE) ×1
SUT VIC AB 3-0 SH 27X BRD (SUTURE) ×1 IMPLANT
SUT VICRYL 4-0 PS2 18IN ABS (SUTURE) ×2 IMPLANT
TOWEL OR 17X24 6PK STRL BLUE (TOWEL DISPOSABLE) IMPLANT
TOWEL OR 17X26 10 PK STRL BLUE (TOWEL DISPOSABLE) ×2 IMPLANT
TRAY ENT MC OR (CUSTOM PROCEDURE TRAY) ×2 IMPLANT
TUBE ENDOTRAC EMG 7X10.2 (MISCELLANEOUS) ×2 IMPLANT
TUBE FEEDING 10FR FLEXIFLO (MISCELLANEOUS) IMPLANT

## 2017-01-17 NOTE — H&P (Signed)
Gina Sullivan is an 31 y.o. female.   Chief Complaint: Papillary carcinoma of thyroid HPI: 31 year old female with right thyroid mass that is positive for papillary carcinoma by FNA.  She presents for surgical management.  Past Medical History:  Diagnosis Date  . Medical history non-contributory     Past Surgical History:  Procedure Laterality Date  . NO PAST SURGERIES      History reviewed. No pertinent family history. Social History:  reports that she has never smoked. She has never used smokeless tobacco. She reports that she drinks alcohol. She reports that she does not use drugs.  Allergies:  Allergies  Allergen Reactions  . No Known Allergies     Medications Prior to Admission  Medication Sig Dispense Refill  . acetaminophen (TYLENOL) 325 MG tablet Take 650 mg by mouth every 6 (six) hours as needed for mild pain.    Marland Kitchen ibuprofen (ADVIL,MOTRIN) 400 MG tablet Take 1 tablet (400 mg total) by mouth every 6 (six) hours as needed. (Patient taking differently: Take 400 mg by mouth every 6 (six) hours as needed for moderate pain. ) 30 tablet 0    No results found for this or any previous visit (from the past 48 hour(s)). No results found.  Review of Systems  All other systems reviewed and are negative.   Blood pressure 118/73, pulse 80, temperature 98.5 F (36.9 C), temperature source Oral, resp. rate 20, height 5' 4.25" (1.632 m), weight 190 lb (86.2 kg), last menstrual period 12/25/2016, SpO2 99 %. Physical Exam  Constitutional: She is oriented to person, place, and time. She appears well-developed and well-nourished. No distress.  HENT:  Head: Normocephalic and atraumatic.  Right Ear: External ear normal.  Left Ear: External ear normal.  Nose: Nose normal.  Mouth/Throat: Oropharynx is clear and moist.  Eyes: Conjunctivae and EOM are normal. Pupils are equal, round, and reactive to light.  Neck: Normal range of motion. Neck supple. Thyromegaly: Right thyroid mass.   Cardiovascular: Normal rate.   Respiratory: Effort normal.  Musculoskeletal: Normal range of motion.  Neurological: She is alert and oriented to person, place, and time. No cranial nerve deficit.  Skin: Skin is warm and dry.  Psychiatric: She has a normal mood and affect. Her behavior is normal. Judgment and thought content normal.     Assessment/Plan Papillary thyroid carcinoma  To OR for total thyroidectomy.  Melida Quitter, MD 01/17/2017, 7:32 AM

## 2017-01-17 NOTE — Op Note (Signed)
NAME:  Gina Sullivan, Gina Sullivan                   ACCOUNT NO.:  MEDICAL RECORD NO.:  [id number]  LOCATION:                                 FACILITY:  PHYSICIAN:  Onnie Graham, MD     DATE OF BIRTH:  01/20/2015  DATE OF PROCEDURE:  01/17/2017 DATE OF DISCHARGE:                              OPERATIVE REPORT   PREOPERATIVE DIAGNOSIS:  Right thyroid papillary carcinoma.  POSTOPERATIVE DIAGNOSIS:  Right thyroid papillary carcinoma.  PROCEDURE:  Total thyroidectomy.  SURGEON:  Onnie Graham, MD.  ANESTHESIA:  General endotracheal anesthesia.  COMPLICATIONS:  None.  INDICATION:  The patient is a 31 year old female who has had a palpable mass in the right thyroid gland for several months.  Fine-needle aspiration demonstrated papillary carcinoma.  She presents to the operating room for surgical management.  FINDINGS:  There was a sizable firm mass in the right superior thyroid lobe that had some adhesion from the surrounding strap muscles.  The rest of the right thyroid lobe and left thyroid lobes were without mass.  DESCRIPTION OF PROCEDURE:  The patient was identified in the holding room.  Informed consent having been obtained including discussion of risks, benefits, alternatives, the patient was brought to the operative suite and put on the operating table in supine position.  Anesthesia was induced and the patient was intubated by the Anesthesia team with a GlideScope without difficulty.  The patient was given intravenous antibiotics during the case.  The eyes were taped closed and a shoulder roll was placed.  The incision was marked with a marking pen and injected with 1% lidocaine with 1:100,000 epinephrine.  The neck was prepped and draped in a sterile fashion.  The incision was made with a 15-blade scalpel and extended through subcutaneous and platysma layers using Bovie electrocautery.  The midline raphe was divided then with Bovie electrocautery.  Right-sided strap muscles  were then retracted over the right thyroid lobe dissecting soft tissue connections.  This was difficult around the mass as the muscles were somewhat adherent, so some of the muscle was left on the lobe and dissected through the muscle somewhat.  Careful dissection was then performed around the periphery of the mass, exposing the superior pedicle which was divided using the Harmonic scalpel.  The superior parathyroid gland was identified and left in place.  Careful dissection was then performed around the lateral periphery of the mass and lobe rolling the lobe inferiorly and medially. Careful dissection was then performed around the deep margin of the lobe, and the recurrent laryngeal nerve was identified and kept intact and safe.  Further dissection was then performed toward Berry's ligament and this was divided to the midline.  The lobe was then laid back into its position and the left-sided strap muscles were then retracted over the left lobe and dissected free.  Dissection was then performed around the lateral periphery and superior periphery of the lobe, exposing the superior pedicle, which was divided with the Harmonic scalpel.  The superior parathyroid gland was also identified on this side as well and left intact.  Dissection performed further around the lateral margin of the lobe, exposing the recurrent laryngeal  nerve which was kept intact. Further dissection freed the lobe from the deep structures including Berry's ligament and the entire thyroid was then removed and passed to Nursing for pathology after first marking the right side with a marking stitch.  The thyroid bed was then copiously irrigated with saline. Bleeding was controlled bipolar electrocautery.  The patient was given Valsalva maneuver and there was no additional bleeding.  Surgicel was placed down into the bed of each side of the thyroid.  With no further bleeding seen, the skin was laid back down and closed in  the platysmal layer using 3-0 Vicryl suture in a simple running fashion and the subcutaneous layer using 4-0 Vicryl suture in a simple interrupted fashion.  The skin was closed with Dermabond.  The patient was then cleaned off and drapes were removed.  During wake up, pressure was held against the neck.  She was then extubated in the recovery room in stable condition.     Onnie Graham, MD     DDB/MEDQ  D:  01/17/2017  T:  01/17/2017  Job:  [id number]  cc:   Onnie Graham, MD's office

## 2017-01-17 NOTE — Progress Notes (Signed)
   Subjective:    Patient ID: Gina Sullivan, female    DOB: 06/09/1986, 31 y.o.   MRN: [id number]  HPI Some neck pain.  Drinking fluids.  Voice sounds good.  Review of Systems     Objective:   Physical Exam AF VSS Alert, NAD Normal voice Thyroid incision clean and intact, no fluid collection.     Assessment & Plan:  Papillary thyroid cancer s/p total thyroidectomy Doing well.  Observe overnight.  Replacing calcium and thyroid hormone.

## 2017-01-17 NOTE — Anesthesia Preprocedure Evaluation (Signed)
Anesthesia Evaluation  Patient identified by MRN, date of birth, ID band Patient awake    Reviewed: Allergy & Precautions, NPO status , Patient's Chart, lab work & pertinent test results  History of Anesthesia Complications Negative for: history of anesthetic complications  Airway Mallampati: II  TM Distance: >3 FB Neck ROM: Full    Dental  (+) Teeth Intact   Pulmonary neg pulmonary ROS,    breath sounds clear to auscultation       Cardiovascular negative cardio ROS   Rhythm:Regular     Neuro/Psych negative neurological ROS  negative psych ROS   GI/Hepatic negative GI ROS, Neg liver ROS,   Endo/Other  Papillary thyroid Ca  Renal/GU      Musculoskeletal negative musculoskeletal ROS (+)   Abdominal   Peds  Hematology negative hematology ROS (+)   Anesthesia Other Findings   Reproductive/Obstetrics                             Anesthesia Physical Anesthesia Plan  ASA: II  Anesthesia Plan: General   Post-op Pain Management:    Induction: Intravenous  PONV Risk Score and Plan: 3 and Ondansetron, Dexamethasone, Propofol and Midazolam  Airway Management Planned: Oral ETT  Additional Equipment: None  Intra-op Plan:   Post-operative Plan: Extubation in OR  Informed Consent: I have reviewed the patients History and Physical, chart, labs and discussed the procedure including the risks, benefits and alternatives for the proposed anesthesia with the patient or authorized representative who has indicated his/her understanding and acceptance.   Dental advisory given  Plan Discussed with: CRNA and Surgeon  Anesthesia Plan Comments:         Anesthesia Quick Evaluation

## 2017-01-17 NOTE — Anesthesia Procedure Notes (Signed)
Procedure Name: Intubation Date/Time: 01/17/2017 7:57 AM Performed by: Willeen Cass P Pre-anesthesia Checklist: Patient identified, Emergency Drugs available, Suction available and Patient being monitored Patient Re-evaluated:Patient Re-evaluated prior to inductionOxygen Delivery Method: Circle System Utilized Preoxygenation: Pre-oxygenation with 100% oxygen Intubation Type: IV induction Ventilation: Mask ventilation without difficulty Laryngoscope Size: Glidescope and 3 (Elective GS d/t NIMS) Grade View: Grade I Tube type: Oral (NIMS) Tube size: 7.0 mm Number of attempts: 1 Airway Equipment and Method: Stylet Placement Confirmation: ETT inserted through vocal cords under direct vision,  positive ETCO2 and breath sounds checked- equal and bilateral Secured at: 22 cm Tube secured with: Tape Dental Injury: Teeth and Oropharynx as per pre-operative assessment

## 2017-01-17 NOTE — Transfer of Care (Signed)
Immediate Anesthesia Transfer of Care Note  Patient: Gina Sullivan  Procedure(s) Performed: Procedure(s) with comments: THYROIDECTOMY (Right) - total thyroidectomy  Patient Location: PACU  Anesthesia Type:General  Level of Consciousness: awake, oriented and patient cooperative  Airway & Oxygen Therapy: Patient Spontanous Breathing and Patient connected to nasal cannula oxygen  Post-op Assessment: Report given to RN, Post -op Vital signs reviewed and stable and Patient moving all extremities X 4  Post vital signs: Reviewed and stable  Last Vitals:  Vitals:   01/17/17 0600  BP: 118/73  Pulse: 80  Resp: 20  Temp: 36.9 C    Last Pain:  Vitals:   01/17/17 0600  TempSrc: Oral         Complications: No apparent anesthesia complications

## 2017-01-17 NOTE — Brief Op Note (Signed)
01/17/2017  9:45 AM  PATIENT:  Renee Harder  31 y.o. female  PRE-OPERATIVE DIAGNOSIS:  papillary thyroid carcinoma  POST-OPERATIVE DIAGNOSIS:  papillary thyroid carcinoma  PROCEDURE:  Procedure(s) with comments: THYROIDECTOMY (Right) - total thyroidectomy  SURGEON:  Surgeon(s) and Role:    Melida Quitter, MD - Primary  PHYSICIAN ASSISTANT:   ASSISTANTS: none   ANESTHESIA:   general  EBL:  Total I/O In: 1000 [I.V.:1000] Out: -   BLOOD ADMINISTERED:none  DRAINS: none   LOCAL MEDICATIONS USED:  LIDOCAINE   SPECIMEN:  Source of Specimen:  Total thyroid  DISPOSITION OF SPECIMEN:  PATHOLOGY  COUNTS:  YES  TOURNIQUET:  * No tourniquets in log *  DICTATION: .Other Dictation: Dictation Number [id number]  PLAN OF CARE: Admit for overnight observation  PATIENT DISPOSITION:  PACU - hemodynamically stable.   Delay start of Pharmacological VTE agent (>24hrs) due to surgical blood loss or risk of bleeding: yes

## 2017-01-18 MED ORDER — HYDROCODONE-ACETAMINOPHEN 5-325 MG PO TABS: 1.0000 | ORAL_TABLET | Freq: Four times a day (QID) | ORAL | 0 refills | Status: DC | PRN

## 2017-01-18 MED ORDER — LEVOTHYROXINE SODIUM 150 MCG PO TABS: 150.0000 ug | ORAL_TABLET | Freq: Every day | ORAL | 5 refills | Status: DC

## 2017-01-18 NOTE — Discharge Summary (Signed)
Physician Discharge Summary  Patient ID: Gina Sullivan MRN: [id number] DOB/AGE: 1986-06-23 31 y.o.  Admit date: 01/17/2017 Discharge date: 01/18/2017  Admission Diagnoses: Papillary thyroid carcinoma  Discharge Diagnoses:  Active Problems:   Papillary carcinoma [facility])   Discharged Condition: good  Hospital Course: 31 year old female with right thyroid mass positive for papillary carcinoma by FNA presented for total thyroidectomy.  See operative note.  She was observed overnight and did well.  On POD 1, she was felt stable for discharge.  Consults: None  Significant Diagnostic Studies: None  Treatments: surgery: Total thyroidectomy  Discharge Exam: Blood pressure 107/69, pulse 88, temperature 98.5 F (36.9 C), temperature source Oral, resp. rate 17, height 5\' 4"  (1.626 m), weight 196 lb 10.4 oz (89.2 kg), last menstrual period 12/25/2016, SpO2 98 %. General appearance: alert, cooperative and no distress Neck: normal voice, thyroid incision clean and intact, no fluid collection  Disposition: 01-Home or Self Care  Discharge Instructions    Diet - low sodium heart healthy    Complete by:  As directed    Discharge instructions    Complete by:  As directed    Resume normal diet.  Avoid strenuous activity.  OK to allow water on the incision, gently pat dry.  Do not apply ointment to the incision.  Take OsCal 500 mg, 2 tablets three times per day.  Take additional OsCal pills if you feel muscle spasms, cramps, or tingling.   Increase activity slowly    Complete by:  As directed      Allergies as of 01/18/2017      Reactions   No Known Allergies       Medication List    TAKE these medications   acetaminophen 325 MG tablet Commonly known as:  TYLENOL Take 650 mg by mouth every 6 (six) hours as needed for mild pain.   HYDROcodone-acetaminophen 5-325 MG tablet Commonly known as:  NORCO/VICODIN Take 1-2 tablets by mouth every 6 (six) hours as needed for moderate pain.    ibuprofen 400 MG tablet Commonly known as:  ADVIL,MOTRIN Take 1 tablet (400 mg total) by mouth every 6 (six) hours as needed. What changed:  reasons to take this   levothyroxine 150 MCG tablet Commonly known as:  SYNTHROID, LEVOTHROID Take 1 tablet (150 mcg total) by mouth daily before breakfast.      Follow-up Information    Melida Quitter, MD Follow up on 01/24/2017.   Specialty:  Otolaryngology Contact information: [address] [place] [address] [phone]           Signed: Melida Quitter 01/18/2017, 8:29 AM

## 2017-01-18 NOTE — Progress Notes (Signed)
Pt for discharge going home health teachings given, prescription for pain, next appointment, removed IV peripheral line, no complain of pain, given all her personal belongings.

## 2017-01-20 ENCOUNTER — Inpatient Hospital Stay (HOSPITAL_COMMUNITY)
Admission: EM | Admit: 2017-01-20 | Discharge: 2017-01-22 | DRG: 641 | Disposition: A | Discharge status: Home / Self Care | Payer: Self-pay | Attending: Family Medicine | Admitting: Family Medicine

## 2017-01-20 ENCOUNTER — Emergency Department (HOSPITAL_COMMUNITY): Payer: Self-pay

## 2017-01-20 ENCOUNTER — Encounter (HOSPITAL_COMMUNITY): Payer: Self-pay | Admitting: Nurse Practitioner

## 2017-01-20 DIAGNOSIS — K5900 Constipation, unspecified: Secondary | ICD-10-CM | POA: Diagnosis present

## 2017-01-20 DIAGNOSIS — E890 Postprocedural hypothyroidism: Secondary | ICD-10-CM

## 2017-01-20 DIAGNOSIS — Z85850 Personal history of malignant neoplasm of thyroid: Secondary | ICD-10-CM

## 2017-01-20 DIAGNOSIS — Z9889 Other specified postprocedural states: Secondary | ICD-10-CM

## 2017-01-20 DIAGNOSIS — Z79890 Hormone replacement therapy (postmenopausal): Secondary | ICD-10-CM

## 2017-01-20 DIAGNOSIS — Z79899 Other long term (current) drug therapy: Secondary | ICD-10-CM

## 2017-01-20 HISTORY — DX: Hypocalcemia: E83.51

## 2017-01-20 LAB — I-STAT CG4 LACTIC ACID, ED
LACTIC ACID, VENOUS: 2.28 mmol/L — AB (ref 0.5–1.9)
Lactic Acid, Venous: 1.04 mmol/L (ref 0.5–1.9)

## 2017-01-20 LAB — BASIC METABOLIC PANEL
ANION GAP: 9 (ref 5–15)
BUN: 9 mg/dL (ref 6–20)
CO2: 27 mmol/L (ref 22–32)
Calcium: 6.8 mg/dL — ABNORMAL LOW (ref 8.9–10.3)
Chloride: 101 mmol/L (ref 101–111)
Creatinine, Ser: 0.78 mg/dL (ref 0.44–1.00)
GFR calc Af Amer: >60 mL/min (ref >60)
GLUCOSE: 99 mg/dL (ref 65–99)
POTASSIUM: 4 mmol/L (ref 3.5–5.1)
SODIUM: 137 mmol/L (ref 135–145)

## 2017-01-20 LAB — CBC WITH DIFFERENTIAL/PLATELET
Basophils Absolute: 0 10*3/uL (ref 0.0–0.1)
Basophils Relative: 1 %
EOS PCT: 3 %
Eosinophils Absolute: 0.1 10*3/uL (ref 0.0–0.7)
HCT: 41.9 % (ref 36.0–46.0)
Hemoglobin: 14 g/dL (ref 12.0–15.0)
LYMPHS ABS: 1.1 10*3/uL (ref 0.7–4.0)
LYMPHS PCT: 30 %
MCH: 30.2 pg (ref 26.0–34.0)
MCHC: 33.4 g/dL (ref 30.0–36.0)
MCV: 90.5 fL (ref 78.0–100.0)
MONO ABS: 0.1 10*3/uL (ref 0.1–1.0)
Monocytes Relative: 2 %
Neutro Abs: 2.3 10*3/uL (ref 1.7–7.7)
Neutrophils Relative %: 64 %
PLATELETS: 227 10*3/uL (ref 150–400)
RBC: 4.63 MIL/uL (ref 3.87–5.11)
RDW: 13 % (ref 11.5–15.5)
WBC: 3.6 10*3/uL — ABNORMAL LOW (ref 4.0–10.5)

## 2017-01-20 LAB — COMPREHENSIVE METABOLIC PANEL
ALBUMIN: 3.7 g/dL (ref 3.5–5.0)
ALT: 15 U/L (ref 14–54)
AST: 22 U/L (ref 15–41)
Alkaline Phosphatase: 59 U/L (ref 38–126)
Anion gap: 10 (ref 5–15)
BILIRUBIN TOTAL: 0.3 mg/dL (ref 0.3–1.2)
BUN: 12 mg/dL (ref 6–20)
CHLORIDE: 102 mmol/L (ref 101–111)
CO2: 25 mmol/L (ref 22–32)
CREATININE: 0.86 mg/dL (ref 0.44–1.00)
Calcium: 6.5 mg/dL — ABNORMAL LOW (ref 8.9–10.3)
GFR calc Af Amer: >60 mL/min (ref >60)
Glucose, Bld: 128 mg/dL — ABNORMAL HIGH (ref 65–99)
POTASSIUM: 3.4 mmol/L — AB (ref 3.5–5.1)
Sodium: 137 mmol/L (ref 135–145)
Total Protein: 7 g/dL (ref 6.5–8.1)

## 2017-01-20 LAB — URINALYSIS, ROUTINE W REFLEX MICROSCOPIC
BILIRUBIN URINE: NEGATIVE
GLUCOSE, UA: NEGATIVE mg/dL
HGB URINE DIPSTICK: NEGATIVE
Ketones, ur: NEGATIVE mg/dL
Leukocytes, UA: NEGATIVE
Nitrite: NEGATIVE
PROTEIN: NEGATIVE mg/dL
Specific Gravity, Urine: 1.006 (ref 1.005–1.030)
pH: 7 (ref 5.0–8.0)

## 2017-01-20 LAB — MAGNESIUM: MAGNESIUM: 1.5 mg/dL — AB (ref 1.7–2.4)

## 2017-01-20 LAB — PHOSPHORUS: Phosphorus: 6.2 mg/dL — ABNORMAL HIGH (ref 2.5–4.6)

## 2017-01-20 LAB — I-STAT BETA HCG BLOOD, ED (MC, WL, AP ONLY): I-stat hCG, quantitative: <5 m[IU]/mL (ref <5)

## 2017-01-20 MED ORDER — SODIUM CHLORIDE 0.9% FLUSH
3.0000 mL | Freq: Two times a day (BID) | INTRAVENOUS | Status: DC
  Administered 2017-01-21 – 2017-01-22 (×2): 3 mL via INTRAVENOUS

## 2017-01-20 MED ORDER — SODIUM CHLORIDE 0.9 % IV SOLN
1.0000 g | Freq: Once | INTRAVENOUS | Status: AC
  Administered 2017-01-20: 1 g via INTRAVENOUS
  Filled 2017-01-20: qty 10

## 2017-01-20 MED ORDER — IBUPROFEN 400 MG PO TABS: 400.0000 mg | ORAL_TABLET | Freq: Four times a day (QID) | ORAL | Status: DC | PRN

## 2017-01-20 MED ORDER — POLYETHYLENE GLYCOL 3350 17 G PO PACK
17.0000 g | PACK | Freq: Every day | ORAL | Status: DC
  Administered 2017-01-20 – 2017-01-21 (×2): 17 g via ORAL
  Filled 2017-01-20 (×3): qty 1

## 2017-01-20 MED ORDER — POTASSIUM CHLORIDE CRYS ER 20 MEQ PO TBCR
20.0000 meq | EXTENDED_RELEASE_TABLET | Freq: Once | ORAL | Status: AC
  Administered 2017-01-20: 20 meq via ORAL
  Filled 2017-01-20: qty 1

## 2017-01-20 MED ORDER — SODIUM CHLORIDE 0.9 % IV SOLN
2.0000 g | Freq: Once | INTRAVENOUS | Status: AC
  Administered 2017-01-20: 2 g via INTRAVENOUS
  Filled 2017-01-20: qty 20

## 2017-01-20 MED ORDER — MAGNESIUM SULFATE 4 GM/100ML IV SOLN
4.0000 g | Freq: Once | INTRAVENOUS | Status: AC
  Administered 2017-01-21: 4 g via INTRAVENOUS
  Filled 2017-01-20: qty 100

## 2017-01-20 MED ORDER — ACETAMINOPHEN 325 MG PO TABS
650.0000 mg | ORAL_TABLET | Freq: Four times a day (QID) | ORAL | Status: DC | PRN
  Administered 2017-01-21: 650 mg via ORAL
  Filled 2017-01-20: qty 2

## 2017-01-20 MED ORDER — CALCIUM GLUCONATE 10 % IV SOLN
1.0000 g | Freq: Once | INTRAVENOUS | Status: AC
  Administered 2017-01-20: 1 g via INTRAVENOUS
  Filled 2017-01-20: qty 10

## 2017-01-20 MED ORDER — LEVOTHYROXINE SODIUM 75 MCG PO TABS
150.0000 ug | ORAL_TABLET | Freq: Every day | ORAL | Status: DC
  Administered 2017-01-22: 150 ug via ORAL
  Filled 2017-01-20 (×2): qty 2

## 2017-01-20 MED ORDER — SENNA 8.6 MG PO TABS
1.0000 | ORAL_TABLET | Freq: Every day | ORAL | Status: DC
  Administered 2017-01-20 – 2017-01-21 (×2): 8.6 mg via ORAL
  Filled 2017-01-20 (×2): qty 1

## 2017-01-20 MED ORDER — SODIUM CHLORIDE 0.9 % IV BOLUS (SEPSIS)
1000.0000 mL | Freq: Once | INTRAVENOUS | Status: AC
  Administered 2017-01-20: 1000 mL via INTRAVENOUS

## 2017-01-20 MED ORDER — ENOXAPARIN SODIUM 40 MG/0.4ML ~~LOC~~ SOLN
40.0000 mg | SUBCUTANEOUS | Status: DC
  Administered 2017-01-20: 40 mg via SUBCUTANEOUS
  Filled 2017-01-20 (×2): qty 0.4

## 2017-01-20 NOTE — ED Provider Notes (Signed)
[facility] DEPT Provider Note   CSN: [id number] Arrival date & time: 01/20/17  1117   History   Chief Complaint Chief Complaint  Patient presents with  . Pain    HPI Gina Sullivan is a 31 y.o. female.  HPI   31 year old female presents today with complaints of muscular pain and spasms. Patient has a history of thyroid cancer status post total thyroidectomy on 01/17/2017 by Dr. Redmond Baseman of ENT.  She was discharged home on 623 2 days ago in good condition.  Patient notes that she has had ongoing muscle tingling and spasms worse in her face and proximal thighs over the last 2 days.  She notes she contacted ENT who recommended increasing her oral calcium dosing. She notes she is taking 6 x 600 mg daily. Patient notes she has had these symptoms in the past, but they are becoming stronger and lasting longer.  Patient notes she has pressure in her upper abdomen and lower chest, and yesterday felt as if she could not breathe.  She notes symptoms are improved today with some minor coughing.  Patient denies any unilateral swelling or edema, she denies any fever at home, notes some nausea no vomiting.  She reports she has not passed a solid bowel movement since surgery, but is passing gas.  She denies urinary changes.  She reports she is eating and drinking.   Past Medical History:  Diagnosis Date  . Hypocalcemia 01/20/2017  . Papillary carcinoma [facility])     Patient Active Problem List   Diagnosis Date Noted  . Hypocalcemia 01/20/2017  . Papillary carcinoma ([place]) 01/17/2017    Past Surgical History:  Procedure Laterality Date  . THYROIDECTOMY  01/17/2017  . THYROIDECTOMY Right 01/17/2017   Procedure: THYROIDECTOMY;  Surgeon: Melida Quitter, MD;  Location: [place] [facility];  Service: ENT;  Laterality: Right;  total thyroidectomy    OB History    No data available       Home Medications    Prior to Admission medications   Medication Sig Start Date End Date Taking? Authorizing Provider    acetaminophen (TYLENOL) 325 MG tablet Take 650 mg by mouth every 6 (six) hours as needed for mild pain.   Yes [provider]  Calcium Carbonate-Vitamin D (CALCIUM 600+D PO) Take 2 capsules by mouth 3 (three) times daily. Calcium 600/vitamin d 500   Yes [provider]  HYDROcodone-acetaminophen (NORCO/VICODIN) 5-325 MG tablet Take 1-2 tablets by mouth every 6 (six) hours as needed for moderate pain. 01/18/17  Yes Melida Quitter, MD  levothyroxine (SYNTHROID, LEVOTHROID) 150 MCG tablet Take 1 tablet (150 mcg total) by mouth daily before breakfast. 01/18/17  Yes Melida Quitter, MD  ibuprofen (ADVIL,MOTRIN) 400 MG tablet Take 1 tablet (400 mg total) by mouth every 6 (six) hours as needed. 11/13/15   Varney Biles, MD    Family History History reviewed. No pertinent family history.  Social History Social History  Substance Use Topics  . Smoking status: Never Smoker  . Smokeless tobacco: Never Used  . Alcohol use Yes     Comment: wine occ     Allergies   No known allergies   Review of Systems Review of Systems  All other systems reviewed and are negative.    Physical Exam Updated Vital Signs BP (!) 112/51 (BP Location: Right Arm)   Pulse 74   Temp 98.2 F (36.8 C) (Oral)   Resp 17   Ht 5\' 4"  (1.626 m)   Wt 85.8 kg (189 lb 1.6  oz)   LMP 12/25/2016   SpO2 100%   BMI 32.46 kg/m   Physical Exam  Constitutional: She is oriented to person, place, and time. She appears well-developed and well-nourished.  HENT:  Head: Normocephalic and atraumatic.  Eyes: Conjunctivae are normal. Pupils are equal, round, and reactive to light. Right eye exhibits no discharge. Left eye exhibits no discharge. No scleral icterus.  Neck: Normal range of motion. No JVD present. No tracheal deviation present.  Cardiovascular: Normal rate, regular rhythm, normal heart sounds and intact distal pulses.  Exam reveals no gallop and no friction rub.   No murmur heard. Pulmonary/Chest:  Effort normal. No stridor.  Neurological: She is alert and oriented to person, place, and time. She displays normal reflexes. No cranial nerve deficit or sensory deficit. She exhibits normal muscle tone. Coordination normal.  Psychiatric: She has a normal mood and affect. Her behavior is normal. Judgment and thought content normal.  Nursing note and vitals reviewed.   ED Treatments / Results  Labs (all labs ordered are listed, but only abnormal results are displayed) Labs Reviewed  COMPREHENSIVE METABOLIC PANEL - Abnormal; Notable for the following:       Result Value   Potassium 3.4 (*)    Glucose, Bld 128 (*)    Calcium 6.5 (*)    All other components within normal limits  CBC WITH DIFFERENTIAL/PLATELET - Abnormal; Notable for the following:    WBC 3.6 (*)    All other components within normal limits  URINALYSIS, ROUTINE W REFLEX MICROSCOPIC - Abnormal; Notable for the following:    Color, Urine STRAW (*)    All other components within normal limits  I-STAT CG4 LACTIC ACID, ED - Abnormal; Notable for the following:    Lactic Acid, Venous 2.28 (*)    All other components within normal limits  HIV ANTIBODY (ROUTINE TESTING)  BASIC METABOLIC PANEL  BASIC METABOLIC PANEL  BASIC METABOLIC PANEL  BASIC METABOLIC PANEL  MAGNESIUM  PHOSPHORUS  PARATHYROID HORMONE, INTACT (NO CA)  CBC  I-STAT BETA HCG BLOOD, ED ([facility], [facility], [facility] ONLY)  I-STAT CG4 LACTIC ACID, ED    EKG  EKG Interpretation None       Radiology Dg Chest 2 View  Result Date: 01/20/2017 CLINICAL DATA:  Less post thyroid surgery on Friday. The patient reports chest pressure. EXAM: CHEST  2 VIEW COMPARISON:  PA and lateral chest x-ray of January 10, 2017 FINDINGS: The lungs are adequately inflated and clear. There is no pneumothorax, pneumomediastinum, or pleural effusion. The heart and pulmonary vascularity are normal. The bony thorax is unremarkable. IMPRESSION: There is no acute cardiopulmonary abnormality.  Electronically Signed   By: David  Martinique M.D.   On: 01/20/2017 14:55    Procedures Procedures (including critical care time)  Medications Ordered in ED Medications  acetaminophen (TYLENOL) tablet 650 mg (not administered)  levothyroxine (SYNTHROID, LEVOTHROID) tablet 150 mcg (not administered)  ibuprofen (ADVIL,MOTRIN) tablet 400 mg (not administered)  enoxaparin (LOVENOX) injection 40 mg (40 mg Subcutaneous Given 01/20/17 1721)  sodium chloride flush (NS) 0.9 % injection 3 mL (not administered)  senna (SENOKOT) tablet 8.6 mg (not administered)  polyethylene glycol (MIRALAX / GLYCOLAX) packet 17 g (17 g Oral Given 01/20/17 1720)  calcium gluconate 1 g in sodium chloride 0.9 % 100 mL IVPB (0 g Intravenous Stopped 01/20/17 1429)  sodium chloride 0.9 % bolus 1,000 mL (0 mLs Intravenous Stopped 01/20/17 1430)  potassium chloride SA (K-DUR,KLOR-CON) CR tablet 20 mEq (20 mEq Oral Given 01/20/17  1721)  calcium gluconate 1 g in sodium chloride 0.9 % 100 mL IVPB (1 g Intravenous New Bag/Given 01/20/17 1813)     Initial Impression / Assessment and Plan / ED Course  I have reviewed the triage vital signs and the nursing notes.  Pertinent labs & imaging results that were available during my care of the patient were reviewed by me and considered in my medical decision making (see chart for details).      Final Clinical Impressions(s) / ED Diagnoses   Final diagnoses:  Hypocalcemia   31 year old female presents today with complaints of muscle spasms and paresthesias.  Patient status post thyroidectomy.  Her calcium is 6.7 here.  She was given a milligram of calcium gluconate, normal saline which seemed to improve her symptoms.  Patient will need hospital admission for ongoing replacement, she has no signs of QT prolongation or vital sign instabilities here.  Family medicine teaching service to admit.    New Prescriptions Current Discharge Medication List       Francee Gentile 01/20/17  2022    Charlesetta Shanks, MD 02/01/17 [phone]

## 2017-01-20 NOTE — H&P (Signed)
[facility] Admission History and Physical Service Pager: [phone]  Patient name: Gina Sullivan Medical record number: [id number] Date of birth: Sep 30, 1985 Age: 31 y.o. Gender: female  Primary Care Provider: System, Pcp Not In Consultants: none Code Status: full  Chief Complaint: painful muscle spasms  Assessment and Plan: Gina Sullivan is a 31 y.o. female presenting with painful muscle spasms. PMH is significant for papillary carcinoma of the thyroid s/p thyroidectomy on 6/22.   Hypocalcemia- Patient had thyroidectomy for papillary carcinoma on 6/22 by Dr. Redmond Baseman, per his op note superior parathyroid gland bilaterally was left intact. May be due to transient hungry bone syndrome following recent thyroidectomy. Was told to increase calcium intake but did this at home without improvement. Calcium 6.5 in ED, s/p 1 gram IV calcium with improvement in symptoms but not resolution. QTc 461. Normal kidney function. Lactic acid 2.28 in ED, came down to 1.04 with fluids. CXR clear, no signs of infection. UA clear.  -place in observation, attending Dr. Erin Hearing -monitor on telemetry -vitals per floor -continue IV calcium until Calcium > 7.5 -BMP q4 hours -check PTH -check magnesium, replete if low -replete potassium -tylenol as needed for pain, k-pad prn -FYI ENT, Dr. Redmond Baseman, of admission -resume oral calcium once calcium level increased  Constipation- has not had BM in 3 days. Has passed flatus since recent surgery.  -add miralax qd and senna qhs  FEN/GI: regular diet Prophylaxis: lovenox  Disposition: monitor on tele, home pending correction of calcium  History of Present Illness:  Gina Sullivan is a 31 y.o. female presenting with tingling and muscle spasms for past 1 day.   She recently had thyroid surgery. Was told to take 1200 mg Calcium tabs TID. She has been doing this. Yesterday morning she woke up and had tingling all over. She felt weak and had  pressure in her chest. She felt her heart was beating oddly. She reported weakness along with the tingling in her extremities that would come and go. She came into the ED this morning due to this. She received some IV calcium with improvement in symptoms, however once infusion was complete the tingling started to return. Chest pressure/palpitations resolved. She had some shortness of breath with the chest pressure that resolved after calcium. She has had a cough since her surgery, non-productive. No fevers or chills. No urinary frequency or pain with urination. She is constipated, has not had a bowel movement in 3 days.   Incision from thyroidectomy is not painful, she is no longer taking any pain medications.   No other complaints or concerns.   Review Of Systems: Per HPI with the following additions:   Review of Systems  Constitutional: Negative for chills and fever.  Eyes: Negative for blurred vision and double vision.  Respiratory: Positive for cough and shortness of breath.   Cardiovascular: Positive for chest pain and palpitations.  Gastrointestinal: Negative for abdominal pain, constipation, diarrhea, nausea and vomiting.  Musculoskeletal: Positive for myalgias.  Skin: Negative for rash.  Neurological: Positive for tingling, focal weakness and weakness. Negative for seizures and loss of consciousness.  Psychiatric/Behavioral: Negative for depression and substance abuse.    Patient Active Problem List   Diagnosis Date Noted  . Hypocalcemia 01/20/2017  . Papillary carcinoma ([place]) 01/17/2017    Past Medical History: Past Medical History:  Diagnosis Date  . Papillary carcinoma [facility])     Past Surgical History: Past Surgical History:  Procedure Laterality Date  . THYROIDECTOMY  01/17/2017  . THYROIDECTOMY  Right 01/17/2017   Procedure: THYROIDECTOMY;  Surgeon: Melida Quitter, MD;  Location: [facility];  Service: ENT;  Laterality: Right;  total thyroidectomy    Social History: Social  History  Substance Use Topics  . Smoking status: Never Smoker  . Smokeless tobacco: Never Used  . Alcohol use Yes     Comment: wine occ   Additional social history: lives with sister and her husband  Please also refer to relevant sections of EMR.  Family History: History reviewed. No pertinent family history. Negative for thyroid carcinoma.   Allergies and Medications: Allergies  Allergen Reactions  . No Known Allergies    No current facility-administered medications on file prior to encounter.    Current Outpatient Prescriptions on File Prior to Encounter  Medication Sig Dispense Refill  . acetaminophen (TYLENOL) 325 MG tablet Take 650 mg by mouth every 6 (six) hours as needed for mild pain.    Marland Kitchen HYDROcodone-acetaminophen (NORCO/VICODIN) 5-325 MG tablet Take 1-2 tablets by mouth every 6 (six) hours as needed for moderate pain. 20 tablet 0  . levothyroxine (SYNTHROID, LEVOTHROID) 150 MCG tablet Take 1 tablet (150 mcg total) by mouth daily before breakfast. 30 tablet 5  . ibuprofen (ADVIL,MOTRIN) 400 MG tablet Take 1 tablet (400 mg total) by mouth every 6 (six) hours as needed. 30 tablet 0    Objective: BP 116/77   Pulse 90   Temp 99 F (37.2 C) (Oral)   Resp 17   Ht 5\' 4"  (1.626 m)   Wt 187 lb (84.8 kg)   LMP 12/25/2016   SpO2 100%   BMI 32.10 kg/m  Exam: General: well appearing female in NAD Eyes: PERRL. EOMI. ENTM: MMM. No erythema or exudate in posterior oropharynx Neck: transverse incision from prior thyroidectomy clean and in tact Cardiovascular: RRR no MRG Respiratory: CTAB. No increased WOB Gastrointestinal: +BS. Soft, NTND. No organomegaly appreciated MSK: no edema or cyanosis Derm: warm, dry. No rashes noted Neuro: CN2-12 in tact. Sensation in tact throughout. Strength 5/5 in upper and lower extremities bilaterally. Negative Chvostek's and Trousseau signs Psych: appropriate mood and affect  Labs and Imaging: CBC BMET   Recent Labs Lab 01/20/17 1133   WBC 3.6*  HGB 14.0  HCT 41.9  PLT 227    Recent Labs Lab 01/20/17 1133  NA 137  K 3.4*  CL 102  CO2 25  BUN 12  CREATININE 0.86  GLUCOSE 128*  CALCIUM 6.5*     Dg Chest 2 View  Result Date: 01/20/2017 CLINICAL DATA:  Less post thyroid surgery on Friday. The patient reports chest pressure. EXAM: CHEST  2 VIEW COMPARISON:  PA and lateral chest x-ray of January 10, 2017 FINDINGS: The lungs are adequately inflated and clear. There is no pneumothorax, pneumomediastinum, or pleural effusion. The heart and pulmonary vascularity are normal. The bony thorax is unremarkable. IMPRESSION: There is no acute cardiopulmonary abnormality. Electronically Signed   By: David  Martinique M.D.   On: 01/20/2017 14:55     Steve Rattler, DO 01/20/2017, 3:59 PM PGY-1, [facility] Intern pager: [phone], text pages welcome  UPPER LEVEL ADDENDUM  I have read the above note and made revisions highlighted in orange.  Adin Hector, MD, MPH PGY-2 [facility] Pager [phone]

## 2017-01-20 NOTE — ED Triage Notes (Signed)
Pt presents with c/o pain and tingling all over her body. The symptoms began yesterday. She is three days post thyroidectomy, has had some fatigue since the surgery but began to feel worse yesterday with generalized body pain and tingling, cough, nausea. She denies syncope, fevers, chills, abdominal pain, vomiting. She was told to increase her calcium intake yesterday by her doctor but this did not improve her symptoms

## 2017-01-20 NOTE — Progress Notes (Signed)
RN paged on call MD to make him aware that patient's Calcium level is now 6.8 and wondering if any further calcium is needed.  Call returned from on call MD stating he will be ordering 2 additional grams of calcium.   P.J. Linus Mako, RN

## 2017-01-20 NOTE — Anesthesia Postprocedure Evaluation (Signed)
Anesthesia Post Note  Patient: [employer]  Procedure(s) Performed: Procedure(s) (LRB): THYROIDECTOMY (Right)     Patient location during evaluation: PACU Anesthesia Type: General Level of consciousness: awake and alert Pain management: pain level controlled Vital Signs Assessment: post-procedure vital signs reviewed and stable Respiratory status: spontaneous breathing, nonlabored ventilation, respiratory function stable and patient connected to nasal cannula oxygen Cardiovascular status: blood pressure returned to baseline and stable Postop Assessment: no signs of nausea or vomiting Anesthetic complications: no    Last Vitals:  Vitals:   01/17/17 2022 01/18/17 0520  BP: 112/71 107/69  Pulse: 83 88  Resp: 19 17  Temp: 36.5 C 36.9 C    Last Pain:  Vitals:   01/18/17 0614  TempSrc:   PainSc: Asleep                 [name]

## 2017-01-21 LAB — BASIC METABOLIC PANEL
ANION GAP: 12 (ref 5–15)
Anion gap: 10 (ref 5–15)
Anion gap: 11 (ref 5–15)
Anion gap: 8 (ref 5–15)
Anion gap: 9 (ref 5–15)
Anion gap: 9 (ref 5–15)
BUN: 10 mg/dL (ref 6–20)
BUN: 12 mg/dL (ref 6–20)
BUN: 10 mg/dL (ref 6–20)
BUN: 11 mg/dL (ref 6–20)
BUN: 12 mg/dL (ref 6–20)
BUN: 8 mg/dL (ref 6–20)
CALCIUM: 7.7 mg/dL — AB (ref 8.9–10.3)
CHLORIDE: 100 mmol/L — AB (ref 101–111)
CHLORIDE: 102 mmol/L (ref 101–111)
CHLORIDE: 99 mmol/L — AB (ref 101–111)
CO2: 23 mmol/L (ref 22–32)
CO2: 24 mmol/L (ref 22–32)
CO2: 25 mmol/L (ref 22–32)
CO2: 26 mmol/L (ref 22–32)
CO2: 26 mmol/L (ref 22–32)
CO2: 27 mmol/L (ref 22–32)
CREATININE: 0.72 mg/dL (ref 0.44–1.00)
CREATININE: 0.74 mg/dL (ref 0.44–1.00)
CREATININE: 0.78 mg/dL (ref 0.44–1.00)
CREATININE: 0.79 mg/dL (ref 0.44–1.00)
CREATININE: 0.87 mg/dL (ref 0.44–1.00)
Calcium: 6.9 mg/dL — ABNORMAL LOW (ref 8.9–10.3)
Calcium: 7.3 mg/dL — ABNORMAL LOW (ref 8.9–10.3)
Calcium: 7.5 mg/dL — ABNORMAL LOW (ref 8.9–10.3)
Calcium: 7.6 mg/dL — ABNORMAL LOW (ref 8.9–10.3)
Calcium: 8.4 mg/dL — ABNORMAL LOW (ref 8.9–10.3)
Chloride: 100 mmol/L — ABNORMAL LOW (ref 101–111)
Chloride: 100 mmol/L — ABNORMAL LOW (ref 101–111)
Chloride: 102 mmol/L (ref 101–111)
Creatinine, Ser: 0.94 mg/dL (ref 0.44–1.00)
GFR calc Af Amer: >60 mL/min (ref >60)
GFR calc Af Amer: >60 mL/min (ref >60)
GFR calc Af Amer: >60 mL/min (ref >60)
GFR calc Af Amer: >60 mL/min (ref >60)
GFR calc Af Amer: >60 mL/min (ref >60)
GFR calc Af Amer: >60 mL/min (ref >60)
GFR calc non Af Amer: >60 mL/min (ref >60)
GFR calc non Af Amer: >60 mL/min (ref >60)
GFR calc non Af Amer: >60 mL/min (ref >60)
GFR calc non Af Amer: >60 mL/min (ref >60)
GLUCOSE: 114 mg/dL — AB (ref 65–99)
GLUCOSE: 81 mg/dL (ref 65–99)
GLUCOSE: 101 mg/dL — AB (ref 65–99)
GLUCOSE: 99 mg/dL (ref 65–99)
GLUCOSE: 113 mg/dL — AB (ref 65–99)
GLUCOSE: 90 mg/dL (ref 65–99)
POTASSIUM: 4.1 mmol/L (ref 3.5–5.1)
Potassium: 3.6 mmol/L (ref 3.5–5.1)
Potassium: 3.9 mmol/L (ref 3.5–5.1)
Potassium: 4 mmol/L (ref 3.5–5.1)
Potassium: 4.1 mmol/L (ref 3.5–5.1)
Potassium: 4.8 mmol/L (ref 3.5–5.1)
SODIUM: 136 mmol/L (ref 135–145)
SODIUM: 134 mmol/L — AB (ref 135–145)
SODIUM: 136 mmol/L (ref 135–145)
SODIUM: 135 mmol/L (ref 135–145)
Sodium: 136 mmol/L (ref 135–145)
Sodium: 136 mmol/L (ref 135–145)

## 2017-01-21 LAB — CBC
HEMATOCRIT: 39.2 % (ref 36.0–46.0)
Hemoglobin: 13.3 g/dL (ref 12.0–15.0)
MCH: 30.2 pg (ref 26.0–34.0)
MCHC: 33.9 g/dL (ref 30.0–36.0)
MCV: 89.1 fL (ref 78.0–100.0)
PLATELETS: 220 10*3/uL (ref 150–400)
RBC: 4.4 MIL/uL (ref 3.87–5.11)
RDW: 12.7 % (ref 11.5–15.5)
WBC: 4.9 10*3/uL (ref 4.0–10.5)

## 2017-01-21 LAB — HIV ANTIBODY (ROUTINE TESTING W REFLEX): HIV SCREEN 4TH GENERATION: NONREACTIVE

## 2017-01-21 LAB — MAGNESIUM: Magnesium: 2.1 mg/dL (ref 1.7–2.4)

## 2017-01-21 MED ORDER — CALCIUM CARBONATE-VITAMIN D 500-200 MG-UNIT PO TABS
2.0000 | ORAL_TABLET | Freq: Three times a day (TID) | ORAL | Status: DC
  Administered 2017-01-21 (×3): 2 via ORAL
  Filled 2017-01-21 (×3): qty 2

## 2017-01-21 MED ORDER — SODIUM CHLORIDE 0.9 % IV SOLN
2.0000 g | Freq: Once | INTRAVENOUS | Status: AC
  Administered 2017-01-21: 2 g via INTRAVENOUS
  Filled 2017-01-21: qty 20

## 2017-01-21 MED ORDER — CALCIUM CARBONATE-VITAMIN D 500-200 MG-UNIT PO TABS
4.0000 | ORAL_TABLET | Freq: Three times a day (TID) | ORAL | Status: DC
  Administered 2017-01-22 (×3): 4 via ORAL
  Filled 2017-01-21 (×3): qty 4

## 2017-01-21 MED ORDER — POTASSIUM CHLORIDE CRYS ER 20 MEQ PO TBCR
30.0000 meq | EXTENDED_RELEASE_TABLET | Freq: Once | ORAL | Status: AC
  Administered 2017-01-21: 30 meq via ORAL
  Filled 2017-01-21: qty 1

## 2017-01-21 NOTE — Progress Notes (Signed)
  Family Medicine Teaching Service Daily Progress Note Intern Pager: [phone]  Patient name: Gina Sullivan Medical record number: [id number] Date of birth: 03-Apr-1986 Age: 31 y.o. Gender: female  Primary Care Provider: System, Pcp Not In Consultants: ENT Code Status: full  Pt Overview and Major Events to Date:  6/25- admitted to [facility] for hypocalcemia  Assessment and Plan: Gina Sullivan is a 31 y.o. female presenting with painful muscle spasms. PMH is significant for papillary carcinoma of the thyroid s/p thyroidectomy on 6/22.   Hypocalcemia- Ca 6.5 > 6.8 > 7.5 > 6.9, 2 g IV calcium ordered at 7 am. -monitor on telemetry -vitals per floor -continue IV calcium until Calcium > 7.5 -BMP q4 hours -check PTH, pending -check magnesium, replete if low. S/p 4 gram IV mag -replete potassium -tylenol as needed for pain, k-pad prn -FYI ENT, Dr. Redmond Baseman, of admission -resume oral calcium once calcium level increased  Constipation- has not had BM in 3 days. Has passed flatus since recent surgery.  -add miralax qd and senna qhs  FEN/GI: regular diet Prophylaxis: lovenox  Disposition: pending clinical improvement  Subjective:  Feeling well, tingling has resolved. Had BM last night. No SOB/chest pressure.   Objective: Temp:  [97.8 F (36.6 C)-99 F (37.2 C)] 97.8 F (36.6 C) (06/26 0640) Pulse Rate:  [66-90] 79 (06/26 0640) Resp:  [11-23] 18 (06/26 0640) BP: (101-117)/(45-81) 110/64 (06/26 0640) SpO2:  [97 %-100 %] 97 % (06/26 0640) Weight:  [187 lb (84.8 kg)-189 lb 1.6 oz (85.8 kg)] 189 lb 1.6 oz (85.8 kg) (06/25 1644) Physical Exam: General: pleasant lady laying in bed in NAD Cardiovascular: RRR no MRG Respiratory: CTAB Abdomen: soft, NTND Extremities: no edema or cyanosis  Laboratory:  Recent Labs Lab 01/20/17 1133 01/21/17 0449  WBC 3.6* 4.9  HGB 14.0 13.3  HCT 41.9 39.2  PLT 227 220    Recent Labs Lab 01/20/17 1133 01/20/17 2025 01/21/17 0025  01/21/17 0449  NA 137 137 134* 136  K 3.4* 4.0 3.9 4.1  CL 102 101 100* 100*  CO2 25 27 26 27   BUN 12 9 10 11   CREATININE 0.86 0.78 0.78 0.79  CALCIUM 6.5* 6.8* 7.5* 6.9*  PROT 7.0  --   --   --   BILITOT 0.3  --   --   --   ALKPHOS 59  --   --   --   ALT 15  --   --   --   AST 22  --   --   --   GLUCOSE 128* 99 99 101*    Imaging/Diagnostic Tests: Dg Chest 2 View  Result Date: 01/20/2017 CLINICAL DATA:  Less post thyroid surgery on Friday. The patient reports chest pressure. EXAM: CHEST  2 VIEW COMPARISON:  PA and lateral chest x-ray of January 10, 2017 FINDINGS: The lungs are adequately inflated and clear. There is no pneumothorax, pneumomediastinum, or pleural effusion. The heart and pulmonary vascularity are normal. The bony thorax is unremarkable. IMPRESSION: There is no acute cardiopulmonary abnormality. Electronically Signed   By: David  Martinique M.D.   On: 01/20/2017 14:55     Steve Rattler, DO 01/21/2017, 8:02 AM PGY-1, [place] Intern pager: [phone], text pages welcome

## 2017-01-21 NOTE — Discharge Summary (Signed)
[facility] Discharge Summary  Patient name: Gina Sullivan Medical record number: [id number] Date of birth: 26-Nov-1985 Age: 31 y.o. Gender: female Date of Admission: 01/20/2017  Date of Discharge: 01/22/17  Admitting Physician: Baird Kay, MD  Primary Care Provider: System, Pcp Not In Consultants: ENT  Indication for Hospitalization: hypocalcemia  Discharge Diagnoses/Problem List:  Patient Active Problem List   Diagnosis Date Noted  . Hypocalcemia 01/20/2017  . S/P thyroidectomy   . Postoperative hypothyroidism   . Constipation   . 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 She was found to have calcium of 6.5 and given 1 gram IV calcium in ED. She was admitted to [facility] for monitoring. QTc was mildly prolonged. She was given additional doses of IV calcium and BMP was monitored closely. Magnesium was repleted as well. Patient's calcium was monitored closely and she was transitioned to home oral calcium once Calcium was > 7.5. This dose was increased to 2 grams TID. Calcium remained stable at 8.3. She was discharged on 01/22/17 with close ENT follow up.    Issues for Follow Up:  1. Refer to endocrinology per ENT recommendations 2. Recheck Calcium, Magnesium and monitor closely, patient may need to decrease the amount of oral calcium she is taking depending on parathyroid gland function.  Significant Procedures: none  Significant Labs and Imaging:   Recent Labs Lab 01/20/17 1133 01/21/17 0449  WBC 3.6* 4.9  HGB 14.0 13.3  HCT 41.9 39.2  PLT 227 220    Recent Labs Lab 01/20/17 1133 01/20/17 2025  01/21/17 1003   01/21/17 1551 01/21/17 2241 01/22/17 0711 01/22/17 1135 01/22/17 1355  NA 137 137  < > 136  < > 136 135 136 138 138  K 3.4* 4.0  < > 3.6  < > 4.8 4.1 4.1 3.6 3.9  CL 102 101  < > 100*  < > 102 99* 101 100* 101  CO2 25 27  < > 24  < > 25 26 26 28 28   GLUCOSE 128* 99  < > 90  < > 114* 113* 99 103* 84  BUN 12 9  < > 8  < > 12 12 11 10 9   CREATININE 0.86 0.78  < > 0.87  < > 0.72 0.94 0.78 0.80 0.78  CALCIUM 6.5* 6.8*  < > 7.7*  < > 8.4* 7.6* 8.3* 8.4* 8.3*  MG  --  1.5*  --  2.1  --   --   --   --   --   --   PHOS  --  6.2*  --   --   --   --   --   --   --   --   ALKPHOS 59  --   --   --   --   --   --   --   --   --   AST 22  --   --   --   --   --   --   --   --   --   ALT 15  --   --   --   --   --   --   --   --   --  ALBUMIN 3.7  --   --   --   --   --   --   --   --   --   < > = values in this interval not displayed. Magnesium 1.5 > 2.1 Vitamin D 26.2  Dg Chest 2 View Result Date: 01/20/2017 IMPRESSION: There is no acute cardiopulmonary abnormality.  Results/Tests Pending at Time of Discharge: PTH level  Discharge Medications:  Allergies as of 01/22/2017      Reactions   No Known Allergies       Medication List    STOP taking these medications   HYDROcodone-acetaminophen 5-325 MG tablet Commonly known as:  NORCO/VICODIN     TAKE these medications   acetaminophen 325 MG tablet Commonly known as:  TYLENOL Take 650 mg by mouth every 6 (six) hours as needed for mild pain.   Calcium Carbonate-Vitamin D 600-200 MG-UNIT Tabs Commonly known as:  CALCIUM 600+D Take 4 tablets by mouth 3 (three) times daily. Calcium 600/vitamin d 500 What changed:  medication strength  how much to take   ibuprofen 400 MG tablet Commonly known as:  ADVIL,MOTRIN Take 1 tablet (400 mg total) by mouth every 6 (six) hours as needed.   levothyroxine 150 MCG tablet Commonly known as:  SYNTHROID, LEVOTHROID Take 1 tablet (150 mcg total) by mouth daily before breakfast.        Discharge Instructions: Please refer to Patient Instructions section of EMR for full details.  Patient was counseled important signs and symptoms that should prompt return to medical care, changes in medications, dietary instructions, activity restrictions, and follow up appointments.   Follow-Up Appointments: Follow-up Information    Melida Quitter, MD. Go to.   Specialty:  Otolaryngology Why:  as scheduled Contact information: [address] [place] [address] [phone]           Steve Rattler, DO 01/22/2017, 2:55 PM PGY-1, [place]

## 2017-01-21 NOTE — Progress Notes (Addendum)
   Subjective:    Patient ID: Gina Sullivan, female    DOB: [birth year], 31 y.o.   MRN: [id number]  HPI Admission for hypocalcemia discussed with her doctor.  She reports feeling much better after replacement.  Review of Systems     Objective:   Physical Exam AF VSS Alert, NAD Normal voice Neck incision clean and intact, no fluid collection    Assessment & Plan:  Hypocalcemia following total thyroidectomy for papillary carcinoma of thyroid  Discussed scenario with patient.  Transient hypocalcemia is not rare after total thyroidectomy.  Some will have an ongoing problem.  I encouraged her to be patient until an oral regimen to maintain her calcium level can be determined.  Will require increase in oral regimen on which she was previously discharged, possibly doubled.  This may be able to be weaned in the future as an outpatient.  I reviewed pathology with the patient demonstrating papillary carcinoma in two foci with extrathyroidal extension and slightly involved margins.  She will need radioactive iodine therapy in six weeks.  She will follow-up with me in one week.  Appreciate medical care.

## 2017-01-21 NOTE — Progress Notes (Signed)
Calcium dropped from 7.5 to 6.9, last dose of calcium gluconate 2 gr given at 2300. No active order to recheck Magnesium. MD notified, awaiting response.

## 2017-01-22 LAB — BASIC METABOLIC PANEL
ANION GAP: 9 (ref 5–15)
ANION GAP: 10 (ref 5–15)
ANION GAP: 9 (ref 5–15)
BUN: 11 mg/dL (ref 6–20)
BUN: 10 mg/dL (ref 6–20)
BUN: 9 mg/dL (ref 6–20)
CALCIUM: 8.4 mg/dL — AB (ref 8.9–10.3)
CALCIUM: 8.3 mg/dL — AB (ref 8.9–10.3)
CO2: 26 mmol/L (ref 22–32)
CO2: 28 mmol/L (ref 22–32)
CO2: 28 mmol/L (ref 22–32)
Calcium: 8.3 mg/dL — ABNORMAL LOW (ref 8.9–10.3)
Chloride: 101 mmol/L (ref 101–111)
Chloride: 100 mmol/L — ABNORMAL LOW (ref 101–111)
Chloride: 101 mmol/L (ref 101–111)
Creatinine, Ser: 0.78 mg/dL (ref 0.44–1.00)
Creatinine, Ser: 0.8 mg/dL (ref 0.44–1.00)
Creatinine, Ser: 0.78 mg/dL (ref 0.44–1.00)
Glucose, Bld: 99 mg/dL (ref 65–99)
Glucose, Bld: 103 mg/dL — ABNORMAL HIGH (ref 65–99)
Glucose, Bld: 84 mg/dL (ref 65–99)
Potassium: 4.1 mmol/L (ref 3.5–5.1)
Potassium: 3.6 mmol/L (ref 3.5–5.1)
Potassium: 3.9 mmol/L (ref 3.5–5.1)
SODIUM: 138 mmol/L (ref 135–145)
Sodium: 136 mmol/L (ref 135–145)
Sodium: 138 mmol/L (ref 135–145)

## 2017-01-22 LAB — PARATHYROID HORMONE, INTACT (NO CA): PTH: 13 pg/mL — AB (ref 15–65)

## 2017-01-22 LAB — VITAMIN D 25 HYDROXY (VIT D DEFICIENCY, FRACTURES): VIT D 25 HYDROXY: 26.2 ng/mL — AB (ref 30.0–100.0)

## 2017-01-22 MED ORDER — CALCIUM CARBONATE-VITAMIN D 600-200 MG-UNIT PO TABS: 4.0000 | ORAL_TABLET | Freq: Three times a day (TID) | ORAL | 0 refills | Status: DC

## 2017-01-22 NOTE — Progress Notes (Signed)
Gina Sullivan to be D/C'd to home per MD order.  Discussed with the patient and all questions fully answered.  VSS, Skin clean, dry and intact without evidence of skin break down, no evidence of skin tears noted. IV catheter discontinued intact. Site without signs and symptoms of complications. Dressing and pressure applied.  An After Visit Summary was printed and given to the patient. Patient received prescription.  D/c education completed with patient/family including follow up instructions, medication list, d/c activities limitations if indicated, with other d/c instructions as indicated by MD - patient able to verbalize understanding, all questions fully answered.   Patient instructed to return to ED, call 911, or call MD for any changes in condition.   Patient escorted via [place], and D/C home via private auto.  Morley Kos Price 01/22/2017 4:22 PM

## 2017-01-22 NOTE — Discharge Instructions (Signed)
Por favor tome 4 pldoras de calcio 3 veces al da. Si tiene ms hormigueo o espasmos musculares, vaya a la sala de emergencias o llame a la oficina del Dr. Redmond Baseman. Con el tiempo, es posible que no necesite tanto calcio. El Dr. Redmond Baseman seguir el nivel de calcio y disminuir las pldoras si es posible.  Hipocalcemia en los adultos (Hypocalcemia, Adult) La hipocalcemia ocurre cuando el nivel de calcio de una persona est por debajo de lo normal. El calcio es un mineral que el organismo utiliza de muchas formas. La falta de calcio puede afectar el corazn y los msculos, hacer que los huesos sean ms propensos a las fracturas y causar otros problemas. CAUSAS Esta afeccin puede ser causada por lo siguiente:  Disminucin de la produccin (hipoparatiroidismo) o uso inadecuado de la hormona paratiroidea.  Problemas o la extirpacin [place] de las glndulas paratiroideas.  Problemas con la funcin paratiroidea despus de la extirpacin de la glndula tiroides.  Falta (deficiencia) de vitamina D, de magnesio o ambos.  Problemas renales. Algunas causas menos frecuentes incluyen lo siguiente:  Problemas intestinales que interfieren con la absorcin de nutrientes.  Alcoholismo.  Bajos niveles de una protena del organismo llamada albmina.  Inflamacin del pncreas (pancreatitis).  Algunos medicamentos.  Infecciones graves (sepsis).  Ciertas enfermedades, como sarcoidosis o hemocromatosis, que hacen que las glndulas paratiroideas se llenen de clulas o sustancias que normalmente no estn presentes.  Descomposicin de grandes cantidades de fibra muscular.  Altos niveles de fosfatos en el cuerpo.  Cncer.  Transfusiones de [employer] que normalmente ocurren en casos de traumatismos graves. SNTOMAS Los sntomas de esta afeccin incluyen lo siguiente:  Entumecimiento y hormigueo de los dedos de los pies o alrededor de Equities trader.  Dolores o [employer], especialmente en  las piernas, los pies y la espalda.  Espasmos musculares.  Clicos o dolor en el abdomen.  Problemas de memoria, confusin o dificultad para pensar.  Depresin, ansiedad, irritabilidad o cambios en la personalidad.  Desmayos.  Dolor en el pecho.  Dificultad para tragar.  Cambios en el sonido de la voz.  Dificultad para respirar o sibilancias.  Fatiga y debilidad general. Los sntomas de la hipocalcemia grave incluyen lo siguiente:  Sacudidas incontrolables (convulsiones).  Reflejo exagerado y prolongado de cierre gltico (laringoespasmo).  Latidos cardacos rpidos (palpitaciones) y ritmo cardaco anormal (arritmias). Los sntomas a largo plazo de esta afeccin incluyen lo siguiente:  Herold Harms y uas gruesos y Publishing rights manager.  Piel seca o enfermedades de la piel (psoriasis, eczema, o dermatitis) de larga duracin (crnicas).  Opacidad en el cristalino del ojo (cataratas). DIAGNSTICO Por lo general, este trastorno se diagnostica mediante un anlisis de [place]. Tambin se pueden hacer otros estudios para ayudar a Office manager causa preexistente de la afeccin. Por ejemplo, se puede realizar un estudio que registra la actividad elctrica del corazn (electrocardiograma o ECG). TRATAMIENTO El tratamiento de esta afeccin puede incluir lo siguiente:  Calcio administrado por boca (va oral) o de una va intravenosa que se coloca en una vena. El mtodo utilizado para la administracin del calcio depender de la gravedad de la afeccin.  Otros minerales (electrolitos), [employer]. [place] de otros tratamientos depender de la causa de la afeccin. INSTRUCCIONES PARA EL CUIDADO EN EL HOGAR  Siga las indicaciones del mdico o nutricionista en lo que respecta a la dieta.  Tome los suplementos solamente como se lo haya indicado el mdico.  Concurra a todas las visitas de control como se lo haya indicado el  mdico. Esto es importante. SOLICITE ATENCIN MDICA SI:  Aumenta  la fatiga.  Aumentan los espasmos musculares.  Observa una nueva hinchazn en los pies, tobillos o piernas.  Sufre cambios en el estado de nimo, en la memoria o en la personalidad. SOLICITE ATENCIN MDICA DE INMEDIATO SI:  Siente dolor en el pecho.  Siente latidos cardacos rpidos o irregulares de [employer].  Tiene dificultad para respirar.  Se desmaya.  Comienza a tener convulsiones.  Se siente confundido. Esta informacin no tiene Marine scientist el consejo del mdico. Asegrese de hacerle al mdico cualquier pregunta que tenga. Document Released: 07/01/2012 Document Revised: 11/06/2015 Document Reviewed: 11/30/2014 Elsevier Interactive Patient Education  2018 [employer].

## 2017-01-22 NOTE — Progress Notes (Signed)
  Family Medicine Teaching Service Daily Progress Note Intern Pager: [phone]  Patient name: Gina Sullivan Medical record number: [id number] Date of birth: Jul 15, 1986 Age: 31 y.o. Gender: female  Primary Care Provider: System, Pcp Not In Consultants: ENT Code Status: full  Pt Overview and Major Events to Date:  6/25- admitted to [facility] for hypocalcemia  Assessment and Plan: Gina Sullivan is a 31 y.o. female presenting with painful muscle spasms. PMH is significant for papillary carcinoma of the thyroid s/p thyroidectomy on 6/22.   Hypocalcemia- Ca overnight 7.3 > 8.4 > 7.6 (2 g IV cal) > 8.3 this morning Vit D low at 26.2 -monitor on telemetry -vitals per floor -recheck BMP at noon -PTH pending -replete potassium -tylenol as needed for pain, k-pad prn -continue oral calcium 2 grams TID  Constipation- resolved -continue miralax qd and senna qhs  FEN/GI: regular diet Prophylaxis: lovenox  Disposition: pending stabilization of calcium  Subjective:  Feels well, no concerns or complaints. Tingling subsided over 24 hours.   Objective: Temp:  [97.9 F (36.6 C)-98.4 F (36.9 C)] 97.9 F (36.6 C) (06/27 0542) Pulse Rate:  [72-83] 72 (06/27 0542) Resp:  [17-18] 18 (06/27 0542) BP: (103-110)/(60-73) 107/73 (06/27 0542) SpO2:  [97 %-99 %] 98 % (06/27 0542) Physical Exam: General: pleasant lady laying in bed in NAD Cardiovascular: RRR no MRG Respiratory: CTAB Abdomen: soft, NTND Extremities: no edema or cyanosis  Laboratory:  Recent Labs Lab 01/20/17 1133 01/21/17 0449  WBC 3.6* 4.9  HGB 14.0 13.3  HCT 41.9 39.2  PLT 227 220    Recent Labs Lab 01/20/17 1133  01/21/17 1551 01/21/17 2241 01/22/17 0711  NA 137  < > 136 135 136  K 3.4*  < > 4.8 4.1 4.1  CL 102  < > 102 99* 101  CO2 25  < > 25 26 26   BUN 12  < > 12 12 11   CREATININE 0.86  < > 0.72 0.94 0.78  CALCIUM 6.5*  < > 8.4* 7.6* 8.3*  PROT 7.0  --   --   --   --   BILITOT 0.3  --   --   --   --    ALKPHOS 59  --   --   --   --   ALT 15  --   --   --   --   AST 22  --   --   --   --   GLUCOSE 128*  < > 114* 113* 99  < > = values in this interval not displayed.  Imaging/Diagnostic Tests: No results found.   Steve Rattler, DO 01/22/2017, 9:26 AM PGY-1, [place] Intern pager: [phone], text pages welcome

## 2017-03-19 ENCOUNTER — Encounter (HOSPITAL_COMMUNITY)
Admission: RE | Admit: 2017-03-19 | Discharge: 2017-03-19 | Disposition: A | Discharge status: Home / Self Care | Payer: Medicaid Other | Source: Ambulatory Visit | Attending: Endocrinology | Admitting: Endocrinology

## 2017-03-19 ENCOUNTER — Other Ambulatory Visit (HOSPITAL_COMMUNITY): Payer: Self-pay | Admitting: Endocrinology

## 2017-03-19 DIAGNOSIS — C73 Malignant neoplasm of thyroid gland: Secondary | ICD-10-CM

## 2017-03-19 MED ORDER — THYROTROPIN ALFA 1.1 MG IM SOLR
0.9000 mg | INTRAMUSCULAR | Status: AC
  Administered 2017-03-19: 0.9 mg via INTRAMUSCULAR

## 2017-03-19 MED ORDER — STERILE WATER FOR INJECTION IJ SOLN
INTRAMUSCULAR | Status: AC
  Filled 2017-03-19: qty 10

## 2017-03-19 MED ORDER — STERILE WATER FOR INJECTION IJ SOLN
1.2000 mL | Freq: Once | INTRAMUSCULAR | Status: AC
  Administered 2017-03-19: 1.2 mL via INTRAMUSCULAR

## 2017-03-20 ENCOUNTER — Encounter (HOSPITAL_COMMUNITY)
Admission: RE | Admit: 2017-03-20 | Discharge: 2017-03-20 | Disposition: A | Discharge status: Home / Self Care | Payer: Medicaid Other | Source: Ambulatory Visit | Attending: Endocrinology | Admitting: Endocrinology

## 2017-03-20 NOTE — Progress Notes (Signed)
Pt here 8/23 for thyrogen shot in nuc Med. Med given L upper hip area by RN.

## 2017-03-21 ENCOUNTER — Encounter (HOSPITAL_COMMUNITY)
Admission: RE | Admit: 2017-03-21 | Discharge: 2017-03-21 | Disposition: A | Discharge status: Home / Self Care | Payer: Self-pay | Source: Ambulatory Visit | Attending: Endocrinology | Admitting: Endocrinology

## 2017-03-21 LAB — HCG, SERUM, QUALITATIVE: Preg, Serum: NEGATIVE

## 2017-03-21 MED ORDER — SODIUM IODIDE I 131 CAPSULE
101.0000 | Freq: Once | INTRAVENOUS | Status: AC | PRN
  Administered 2017-03-21: 101 via ORAL

## 2017-03-24 MED FILL — Thyrotropin Alfa For Inj 1.1 MG: INTRAMUSCULAR | Qty: 0.9 | Status: AC

## 2017-03-28 ENCOUNTER — Encounter (HOSPITAL_COMMUNITY)
Admission: RE | Admit: 2017-03-28 | Discharge: 2017-03-28 | Disposition: A | Discharge status: Home / Self Care | Payer: Medicaid Other | Source: Ambulatory Visit | Attending: Endocrinology | Admitting: Endocrinology

## 2017-03-28 DIAGNOSIS — C73 Malignant neoplasm of thyroid gland: Secondary | ICD-10-CM

## 2017-11-15 ENCOUNTER — Emergency Department (HOSPITAL_COMMUNITY)
Admission: EM | Admit: 2017-11-15 | Discharge: 2017-11-15 | Disposition: A | Discharge status: Home / Self Care | Payer: Self-pay | Attending: Emergency Medicine | Admitting: Emergency Medicine

## 2017-11-15 ENCOUNTER — Other Ambulatory Visit: Payer: Self-pay

## 2017-11-15 ENCOUNTER — Encounter (HOSPITAL_COMMUNITY): Payer: Self-pay

## 2017-11-15 DIAGNOSIS — Z79899 Other long term (current) drug therapy: Secondary | ICD-10-CM | POA: Insufficient documentation

## 2017-11-15 DIAGNOSIS — Z760 Encounter for issue of repeat prescription: Secondary | ICD-10-CM | POA: Insufficient documentation

## 2017-11-15 DIAGNOSIS — R110 Nausea: Secondary | ICD-10-CM | POA: Insufficient documentation

## 2017-11-15 DIAGNOSIS — R42 Dizziness and giddiness: Secondary | ICD-10-CM | POA: Insufficient documentation

## 2017-11-15 DIAGNOSIS — Z85850 Personal history of malignant neoplasm of thyroid: Secondary | ICD-10-CM | POA: Insufficient documentation

## 2017-11-15 HISTORY — DX: Malignant (primary) neoplasm, unspecified: C80.1

## 2017-11-15 HISTORY — DX: Anemia, unspecified: D64.9

## 2017-11-15 LAB — BASIC METABOLIC PANEL
Anion gap: 8 (ref 5–15)
BUN: 15 mg/dL (ref 6–20)
CO2: 23 mmol/L (ref 22–32)
Calcium: 8.8 mg/dL — ABNORMAL LOW (ref 8.9–10.3)
Chloride: 106 mmol/L (ref 101–111)
Creatinine, Ser: 0.72 mg/dL (ref 0.44–1.00)
GFR calc Af Amer: >60 mL/min (ref >60)
GFR calc non Af Amer: >60 mL/min (ref >60)
Glucose, Bld: 78 mg/dL (ref 65–99)
POTASSIUM: 4.6 mmol/L (ref 3.5–5.1)
SODIUM: 137 mmol/L (ref 135–145)

## 2017-11-15 LAB — CBC WITH DIFFERENTIAL/PLATELET
Basophils Absolute: 0 10*3/uL (ref 0.0–0.1)
Basophils Relative: 1 %
Eosinophils Absolute: 0.2 10*3/uL (ref 0.0–0.7)
Eosinophils Relative: 5 %
HCT: 40.3 % (ref 36.0–46.0)
HEMOGLOBIN: 13 g/dL (ref 12.0–15.0)
LYMPHS ABS: 1.2 10*3/uL (ref 0.7–4.0)
LYMPHS PCT: 36 %
MCH: 29.7 pg (ref 26.0–34.0)
MCHC: 32.3 g/dL (ref 30.0–36.0)
MCV: 92.2 fL (ref 78.0–100.0)
Monocytes Absolute: 0.2 10*3/uL (ref 0.1–1.0)
Monocytes Relative: 8 %
NEUTROS ABS: 1.6 10*3/uL — AB (ref 1.7–7.7)
NEUTROS PCT: 50 %
Platelets: 239 10*3/uL (ref 150–400)
RBC: 4.37 MIL/uL (ref 3.87–5.11)
RDW: 12.9 % (ref 11.5–15.5)
WBC: 3.2 10*3/uL — AB (ref 4.0–10.5)

## 2017-11-15 LAB — I-STAT BETA HCG BLOOD, ED (MC, WL, AP ONLY): I-stat hCG, quantitative: <5 m[IU]/mL (ref <5)

## 2017-11-15 MED ORDER — LEVOTHYROXINE SODIUM 137 MCG PO TABS: 137.0000 ug | ORAL_TABLET | Freq: Every day | ORAL | 0 refills | Status: DC

## 2017-11-15 NOTE — ED Triage Notes (Signed)
Pt c/o dizziness and needing refill on thyroid medication. Pt states she has been out for 10 months.

## 2017-11-15 NOTE — Discharge Instructions (Signed)
Follow-up with your primary care doctor or the [facility] listed in the paperwork.   Follow up with your thyroid doctor. Call and arrange an appointment to get consistent refills of your medication.   Return to the Emergency Department for any fever, chest pain, difficulty breathing, abdominal pain, nausea/vomiting or any other worsening or concerning symptoms.

## 2017-11-15 NOTE — ED Notes (Signed)
Pt history information found on MRN [id number]

## 2017-11-15 NOTE — ED Notes (Signed)
Declined W/C at D/C and was escorted to lobby by RN. 

## 2017-11-15 NOTE — ED Notes (Signed)
Pt states "I feel dizzy a week ago but am coming for my medicine today"

## 2017-11-15 NOTE — ED Provider Notes (Signed)
[facility] EMERGENCY DEPARTMENT Provider Note   CSN: [id number] Arrival date & time: 11/15/17  1027     History   Chief Complaint Chief Complaint  Patient presents with  . Medication Refill    HPI Gina Sullivan is a 32 y.o. female who presents for evaluation of medication refill.  Patient reports that she takes levothyroxine daily and states that she has been out of her medication since yesterday.  Patient reports that she has not seen her endocrinologist for several months and states that she was told she was out of refills.  Patient states she did not know she was out of refills and cannot schedule an appointment with her Endocrinology to get the medication refilled.  Patient also reports that she has had 2 weeks of intermittent dizziness, nausea.  Patient describes his dizziness as a lightheaded sensation and denies any room spinning sensation.  She states she does not have this sensation constantly. Patient states that he has not had any vomiting or abdominal pain.  She has been able to eat and drink without any difficulty.  Patient does report a history of anemia several years ago.  She states she is not currently on any iron supplements.  Patient denies any fevers, vision changes, chest pain, difficulty breathing, numbness/weakness of her extremities, blood in stools, vomiting, difficulty ambulating, neck pain, back pain.  The history is provided by the patient. The history is limited by a language barrier. A language interpreter was used.    Past Medical History:  Diagnosis Date  . Anemia   . Cancer ([place])    thyroid     There are no active problems to display for this patient.   Past Surgical History:  Procedure Laterality Date  . THYROIDECTOMY       OB History   None      Home Medications    Prior to Admission medications   Medication Sig Start Date End Date Taking? Authorizing Provider  levothyroxine (SYNTHROID, LEVOTHROID) 137 MCG tablet  Take 137 mcg by mouth daily before breakfast.   Yes [provider]  levothyroxine (SYNTHROID, LEVOTHROID) 137 MCG tablet Take 1 tablet (137 mcg total) by mouth daily before breakfast for 14 days. 11/15/17 11/29/17  Volanda Napoleon, PA-C    Family History No family history on file.  Social History Social History   Tobacco Use  . Smoking status: Never Smoker  . Smokeless tobacco: Never Used  Substance Use Topics  . Alcohol use: Never    Frequency: Never  . Drug use: Never     Allergies   Patient has no known allergies.   Review of Systems Review of Systems  Constitutional: Negative for fever.  Eyes: Negative for visual disturbance.  Respiratory: Negative for cough and shortness of breath.   Cardiovascular: Negative for chest pain.  Gastrointestinal: Positive for nausea. Negative for abdominal pain and vomiting.  Genitourinary: Negative for dysuria and hematuria.  Musculoskeletal: Negative for back pain, gait problem and neck pain.  Neurological: Positive for light-headedness. Negative for weakness, numbness and headaches.  All other systems reviewed and are negative.    Physical Exam Updated Vital Signs BP 115/78 (BP Location: Right Arm)   Pulse 74   Temp 98.4 F (36.9 C) (Oral)   Resp 16   Ht 5\' 5"  (1.651 m)   Wt 77.1 kg (170 lb)   LMP 10/31/2017   SpO2 99%   BMI 28.29 kg/m   Physical Exam  Constitutional: She is oriented  to person, place, and time. She appears well-developed and well-nourished.  HENT:  Head: Normocephalic and atraumatic.  Mouth/Throat: Oropharynx is clear and moist and mucous membranes are normal.  Eyes: Pupils are equal, round, and reactive to light. Conjunctivae, EOM and lids are normal.  Neck: Full passive range of motion without pain. No thyroid mass present.  Well-healed surgical incision scar noted to the anterior aspect of the neck.  No neck mass, swelling.  Cardiovascular: Normal rate, regular rhythm, normal heart sounds  and normal pulses. Exam reveals no gallop and no friction rub.  No murmur heard. Pulmonary/Chest: Effort normal and breath sounds normal.  No evidence of respiratory distress. Able to speak in full sentences without difficulty.  Abdominal: Soft. Normal appearance. There is no tenderness. There is no rigidity and no guarding.  Abdomen is soft, non-distended, non-tender.   Musculoskeletal: Normal range of motion.  Neurological: She is alert and oriented to person, place, and time.  Cranial nerves III-XII intact Follows commands, Moves all extremities  5/5 strength to BUE and BLE  Sensation intact throughout all major nerve distributions Normal finger to nose. No dysdiadochokinesia. No pronator drift. No gait abnormalities  No slurred speech. No facial droop.   Skin: Skin is warm and dry. Capillary refill takes less than 2 seconds.  Psychiatric: She has a normal mood and affect. Her speech is normal.  Nursing note and vitals reviewed.    ED Treatments / Results  Labs (all labs ordered are listed, but only abnormal results are displayed) Labs Reviewed  BASIC METABOLIC PANEL - Abnormal; Notable for the following components:      Result Value   Calcium 8.8 (*)    All other components within normal limits  CBC WITH DIFFERENTIAL/PLATELET - Abnormal; Notable for the following components:   WBC 3.2 (*)    Neutro Abs 1.6 (*)    All other components within normal limits  I-STAT BETA HCG BLOOD, ED ([facility], [facility], [facility] ONLY)    EKG None  Radiology No results found.  Procedures Procedures (including critical care time)  Medications Ordered in ED Medications - No data to display   Initial Impression / Assessment and Plan / ED Course  I have reviewed the triage vital signs and the nursing notes.  Pertinent labs & imaging results that were available during my care of the patient were reviewed by me and considered in my medical decision making (see chart for details).     32 y.o. F with  past medical history of hypothyroidism who is currently on levothyroxine who presents for evaluation of medication refill.  Patient reports that she ran out of her prescription yesterday and that the pharmacy said she had no more refills.  Patient has not been able to get an appointment with her endocrinologist regarding her medication.  Patient also reports some intermittent dizziness that has been ongoing for the last 2 weeks.  She describes dizziness as a lightheaded sensation.  No room spinning sensation.  No vision changes, chest pain, difficulty breathing, numbness/weakness of legs. Patient is afebrile, non-toxic appearing, sitting comfortably on examination table. Vital signs reviewed and stable.  No neuro deficits noted on exam.  Patient has history of hypothyroidism secondary to thyroidectomy.  She is followed by endocrinology.  Given her complaints, will plan to check beta, CBC, BMP.  BMP with no acute abnormalities.  CBC shows leukopenia 3.2.  Hemoglobin hematocrit are stable.  No other acute abnormalities.  I-STAT beta is unremarkable.  Discussed results with patient.  Vital signs are stable.  We will plan to give a short course of refill of her levothyroxine.  Instructed patient to follow-up with her endocrinologist regarding long-term refills. Patient had ample opportunity for questions and discussion. All patient's questions were answered with full understanding. Strict return precautions discussed. Patient expresses understanding and agreement to plan.   Final Clinical Impressions(s) / ED Diagnoses   Final diagnoses:  Medication refill    ED Discharge Orders        Ordered    levothyroxine (SYNTHROID, LEVOTHROID) 137 MCG tablet  Daily before breakfast     11/15/17 1540       Desma Mcgregor 11/15/17 2128    Gareth Morgan, MD 11/15/17 2339

## 2017-11-17 ENCOUNTER — Encounter (HOSPITAL_COMMUNITY): Payer: Self-pay | Admitting: Nurse Practitioner

## 2018-03-09 ENCOUNTER — Other Ambulatory Visit (HOSPITAL_COMMUNITY): Payer: Self-pay | Admitting: Endocrinology

## 2018-03-09 DIAGNOSIS — C73 Malignant neoplasm of thyroid gland: Secondary | ICD-10-CM

## 2018-03-23 ENCOUNTER — Encounter (HOSPITAL_COMMUNITY)
Admission: RE | Admit: 2018-03-23 | Discharge: 2018-03-23 | Disposition: A | Discharge status: Home / Self Care | Payer: No Typology Code available for payment source | Source: Ambulatory Visit | Attending: Endocrinology | Admitting: Endocrinology

## 2018-03-23 DIAGNOSIS — C73 Malignant neoplasm of thyroid gland: Secondary | ICD-10-CM | POA: Diagnosis not present

## 2018-03-23 MED ORDER — STERILE WATER FOR INJECTION IJ SOLN
INTRAMUSCULAR | Status: AC
  Filled 2018-03-23: qty 10

## 2018-03-23 MED ORDER — THYROTROPIN ALFA 1.1 MG IM SOLR
0.9000 mg | INTRAMUSCULAR | Status: AC
  Administered 2018-03-23: 0.9 mg via INTRAMUSCULAR

## 2018-03-24 ENCOUNTER — Encounter (HOSPITAL_COMMUNITY)
Admission: RE | Admit: 2018-03-24 | Discharge: 2018-03-24 | Disposition: A | Discharge status: Home / Self Care | Payer: No Typology Code available for payment source | Source: Ambulatory Visit | Attending: Endocrinology | Admitting: Endocrinology

## 2018-03-24 DIAGNOSIS — C73 Malignant neoplasm of thyroid gland: Secondary | ICD-10-CM | POA: Diagnosis not present

## 2018-03-24 MED ORDER — THYROTROPIN ALFA 1.1 MG IM SOLR
0.9000 mg | INTRAMUSCULAR | Status: AC
  Administered 2018-03-24: 0.9 mg via INTRAMUSCULAR

## 2018-03-25 ENCOUNTER — Encounter (HOSPITAL_COMMUNITY)
Admission: RE | Admit: 2018-03-25 | Discharge: 2018-03-25 | Disposition: A | Discharge status: Home / Self Care | Payer: No Typology Code available for payment source | Source: Ambulatory Visit | Attending: Endocrinology | Admitting: Endocrinology

## 2018-03-25 ENCOUNTER — Encounter (HOSPITAL_COMMUNITY): Payer: Self-pay | Admitting: Radiology

## 2018-03-25 DIAGNOSIS — C73 Malignant neoplasm of thyroid gland: Secondary | ICD-10-CM | POA: Diagnosis not present

## 2018-03-25 LAB — HCG, SERUM, QUALITATIVE: PREG SERUM: NEGATIVE

## 2018-03-25 MED ORDER — SODIUM IODIDE I 131 CAPSULE
4.0000 | Freq: Once | INTRAVENOUS | Status: AC | PRN
  Administered 2018-03-25: 4 via ORAL

## 2018-03-27 ENCOUNTER — Encounter (HOSPITAL_COMMUNITY)
Admission: RE | Admit: 2018-03-27 | Discharge: 2018-03-27 | Disposition: A | Discharge status: Home / Self Care | Payer: No Typology Code available for payment source | Source: Ambulatory Visit | Attending: Endocrinology | Admitting: Endocrinology

## 2018-03-27 DIAGNOSIS — C73 Malignant neoplasm of thyroid gland: Secondary | ICD-10-CM | POA: Diagnosis not present

## 2018-03-27 IMAGING — NM NM [ID] THYROID CANCER METS WHOLE BODY W/ THYROGEN
5 series · 5 of 5 positions shown · non-contrast
Comparison: 03/28/2017

CLINICAL DATA: Papillary thyroid cancer post thyroidectomy with
postoperative radioactive iodine ablation with 101 millicuries N-1F1
on 03/21/2017

EXAM:
THYROGEN-STIMULATED N-1F1 WHOLE BODY SCAN
TECHNIQUE: The patient received 0.9 mg Thyrogen intramuscularly every 24 hours
for two doses. On the third day the patient returned and received
the radiopharmaceutical, per orally. On the fifth day, the patient
returned and whole body planar images were obtained in the anterior
and posterior projections.
RADIOPHARMACEUTICALS:  4.0 mCi N-1F1 sodium iodide orally

[Series 1: marker · 4.14mm/px · 1 of 1 slices shown (1 of 2)]
[im 1/1]
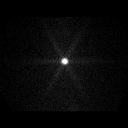

[Series 1: marker · 4.14mm/px · 1 of 1 slices shown (2 of 2)]
[im 1/1  full-range]
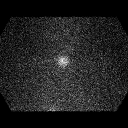

[Series 2: static thyroid no marker · 4.14mm/px · 1 of 1 slices shown]
[im 1/1  full-range]
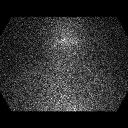

[Series 3: i131 whole body · 2.66mm/px · 1 of 1 slices shown (1 of 2)]
[im 1/1  full-range]
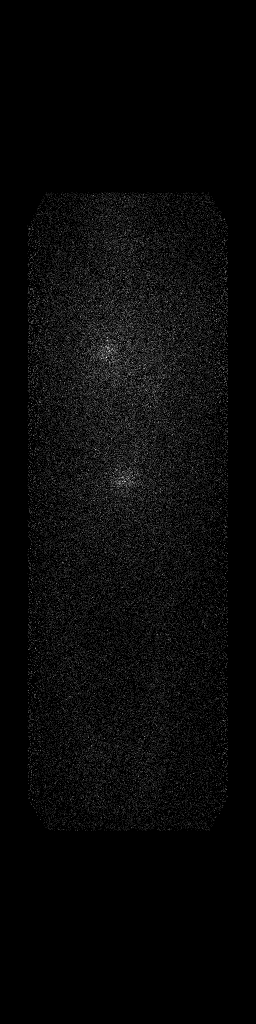

[Series 3: i131 whole body · 2.66mm/px · 1 of 1 slices shown (2 of 2)]
[im 1/1  full-range]
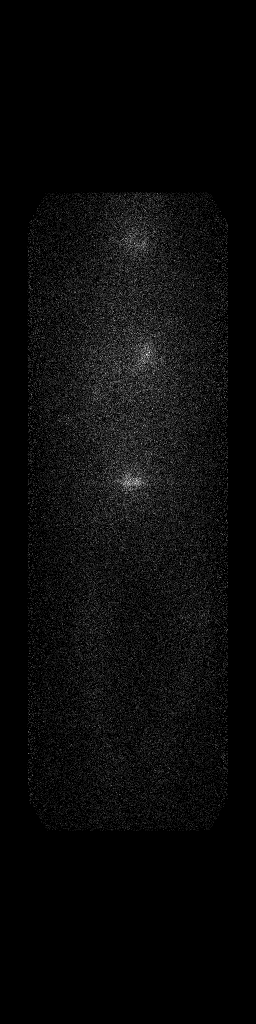

[5 of 5 positions shown; findings below may reference images not displayed]

FINDINGS: Thyroid remnant no longer identified.

Small amount of gastric localization of tracer, physiologic.

Excreted tracer within urinary bladder.

No additional sites of radio iodine accumulation are identified to
suggest iodine-avid metastatic thyroid cancer.
IMPRESSION: No scintigraphic evidence of iodine-avid metastatic thyroid cancer.

## 2019-04-12 ENCOUNTER — Other Ambulatory Visit: Payer: Self-pay

## 2019-04-12 ENCOUNTER — Emergency Department (HOSPITAL_COMMUNITY)
Admission: EM | Admit: 2019-04-12 | Discharge: 2019-04-12 | Disposition: A | Discharge status: Home / Self Care | Payer: No Typology Code available for payment source | Attending: Emergency Medicine | Admitting: Emergency Medicine

## 2019-04-12 ENCOUNTER — Emergency Department (HOSPITAL_COMMUNITY): Payer: No Typology Code available for payment source

## 2019-04-12 DIAGNOSIS — R1013 Epigastric pain: Secondary | ICD-10-CM | POA: Diagnosis not present

## 2019-04-12 DIAGNOSIS — R110 Nausea: Secondary | ICD-10-CM

## 2019-04-12 DIAGNOSIS — M549 Dorsalgia, unspecified: Secondary | ICD-10-CM

## 2019-04-12 DIAGNOSIS — E890 Postprocedural hypothyroidism: Secondary | ICD-10-CM | POA: Insufficient documentation

## 2019-04-12 DIAGNOSIS — Z79899 Other long term (current) drug therapy: Secondary | ICD-10-CM | POA: Insufficient documentation

## 2019-04-12 DIAGNOSIS — R112 Nausea with vomiting, unspecified: Secondary | ICD-10-CM | POA: Insufficient documentation

## 2019-04-12 LAB — URINALYSIS, ROUTINE W REFLEX MICROSCOPIC
Bilirubin Urine: NEGATIVE
Glucose, UA: NEGATIVE mg/dL
Ketones, ur: NEGATIVE mg/dL
Leukocytes,Ua: NEGATIVE
Nitrite: NEGATIVE
Protein, ur: NEGATIVE mg/dL
Specific Gravity, Urine: 1.009 (ref 1.005–1.030)
pH: 7 (ref 5.0–8.0)

## 2019-04-12 LAB — BASIC METABOLIC PANEL
Anion gap: 8 (ref 5–15)
BUN: 10 mg/dL (ref 6–20)
CO2: 26 mmol/L (ref 22–32)
Calcium: 8.6 mg/dL — ABNORMAL LOW (ref 8.9–10.3)
Chloride: 101 mmol/L (ref 98–111)
Creatinine, Ser: 0.77 mg/dL (ref 0.44–1.00)
GFR calc Af Amer: >60 mL/min (ref >60)
GFR calc non Af Amer: >60 mL/min (ref >60)
Glucose, Bld: 111 mg/dL — ABNORMAL HIGH (ref 70–99)
Potassium: 4.1 mmol/L (ref 3.5–5.1)
Sodium: 135 mmol/L (ref 135–145)

## 2019-04-12 LAB — CBC
HCT: 40.8 % (ref 36.0–46.0)
Hemoglobin: 12.9 g/dL (ref 12.0–15.0)
MCH: 29.4 pg (ref 26.0–34.0)
MCHC: 31.6 g/dL (ref 30.0–36.0)
MCV: 92.9 fL (ref 80.0–100.0)
Platelets: 257 10*3/uL (ref 150–400)
RBC: 4.39 MIL/uL (ref 3.87–5.11)
RDW: 12.4 % (ref 11.5–15.5)
WBC: 3.8 10*3/uL — ABNORMAL LOW (ref 4.0–10.5)
nRBC: 0 % (ref 0.0–0.2)

## 2019-04-12 LAB — LIPASE, BLOOD: Lipase: 22 U/L (ref 11–51)

## 2019-04-12 LAB — I-STAT BETA HCG BLOOD, ED (MC, WL, AP ONLY): I-stat hCG, quantitative: <5 m[IU]/mL (ref <5)

## 2019-04-12 LAB — HEPATIC FUNCTION PANEL
ALT: 15 U/L (ref 0–44)
AST: 20 U/L (ref 15–41)
Albumin: 3.9 g/dL (ref 3.5–5.0)
Alkaline Phosphatase: 57 U/L (ref 38–126)
Bilirubin, Direct: <0.1 mg/dL (ref 0.0–0.2)
Total Bilirubin: 0.4 mg/dL (ref 0.3–1.2)
Total Protein: 7.4 g/dL (ref 6.5–8.1)

## 2019-04-12 MED ORDER — ALUM & MAG HYDROXIDE-SIMETH 200-200-20 MG/5ML PO SUSP
15.0000 mL | Freq: Once | ORAL | Status: AC
  Administered 2019-04-12: 15 mL via ORAL
  Filled 2019-04-12: qty 30

## 2019-04-12 MED ORDER — ONDANSETRON 4 MG PO TBDP: 4.0000 mg | ORAL_TABLET | Freq: Three times a day (TID) | ORAL | 0 refills | Status: DC | PRN

## 2019-04-12 MED ORDER — ONDANSETRON 4 MG PO TBDP
4.0000 mg | ORAL_TABLET | Freq: Once | ORAL | Status: AC
  Administered 2019-04-12: 4 mg via ORAL
  Filled 2019-04-12: qty 1

## 2019-04-12 MED ORDER — SUCRALFATE 1 G PO TABS: 1.0000 g | ORAL_TABLET | Freq: Three times a day (TID) | ORAL | 0 refills | Status: DC | PRN

## 2019-04-12 NOTE — ED Notes (Signed)
Patient Alert and oriented to baseline. Stable and ambulatory to baseline. Patient verbalized understanding of the discharge instructions.  Patient belongings were taken by the patient.   

## 2019-04-12 NOTE — ED Triage Notes (Signed)
Pt here from home with c/o right sided flank pain off and on for 3 weeks , along with some lightheadedness , pt thinks she has had kidney stones in the past

## 2019-04-12 NOTE — ED Provider Notes (Signed)
[facility] EMERGENCY DEPARTMENT Provider Note   CSN: [id number] Arrival date & time: 04/12/19  1526     History   Chief Complaint No chief complaint on file.   HPI Gina Sullivan is a 33 y.o. female with a past medical history of papillary thyroid cancer s/p thyroidectomy who presents emergency department with epigastric pain, nausea, and some right-sided flank pain.  Patient reports the symptoms have been occurring off and on for the last 3 weeks but have worsened over the last week.  Patient reports she is also had poor p.o. intake recently due to nausea and has felt lightheaded occasionally.  Patient denies any dysuria or hematuria.  Patient denies any vaginal pain or discharge.  Patient reports she is currently on her menstrual cycle.  Patient also reports some pain across her mid back.  Patient reports he is been trying Tylenol for pain however this has not been helping as much recently.  Patient reports she was prescribed omeprazole recently which helped at first however it has not anymore.  Patient reports she is only been taking omeprazole occasionally.  Patient denies any fevers, vomiting, diarrhea, constipation, shortness of breath, or chest pain.     The history is provided by the patient.    Past Medical History:  Diagnosis Date  . Anemia   . Cancer ([place])    thyroid   . Hypocalcemia 01/20/2017  . Papillary carcinoma [facility])     Patient Active Problem List   Diagnosis Date Noted  . Hypocalcemia 01/20/2017  . S/P thyroidectomy   . Postoperative hypothyroidism   . Constipation   . Papillary carcinoma ([place]) 01/17/2017    Past Surgical History:  Procedure Laterality Date  . THYROIDECTOMY  01/17/2017  . THYROIDECTOMY Right 01/17/2017   Procedure: THYROIDECTOMY;  Surgeon: Melida Quitter, MD;  Location: [facility];  Service: ENT;  Laterality: Right;  total thyroidectomy  . THYROIDECTOMY       OB History   No obstetric history on file.       Home Medications    Prior to Admission medications   Medication Sig Start Date End Date Taking? Authorizing Provider  acetaminophen (TYLENOL) 325 MG tablet Take 650 mg by mouth every 6 (six) hours as needed for mild pain.    [provider]  Calcium Carbonate-Vitamin D (CALCIUM 600+D) 600-200 MG-UNIT TABS Take 4 tablets by mouth 3 (three) times daily. Calcium 600/vitamin d 500 01/22/17   Lucila Maine C, DO  ibuprofen (ADVIL,MOTRIN) 400 MG tablet Take 1 tablet (400 mg total) by mouth every 6 (six) hours as needed. 11/13/15   Varney Biles, MD  levothyroxine (SYNTHROID, LEVOTHROID) 137 MCG tablet Take 137 mcg by mouth daily before breakfast.    [provider]  levothyroxine (SYNTHROID, LEVOTHROID) 137 MCG tablet Take 1 tablet (137 mcg total) by mouth daily before breakfast for 14 days. 11/15/17 11/29/17  Volanda Napoleon, PA-C  levothyroxine (SYNTHROID, LEVOTHROID) 150 MCG tablet Take 1 tablet (150 mcg total) by mouth daily before breakfast. 01/18/17   Melida Quitter, MD  ondansetron (ZOFRAN ODT) 4 MG disintegrating tablet Take 1 tablet (4 mg total) by mouth every 8 (eight) hours as needed for nausea or vomiting. 04/12/19   Dagen Beevers, Missy Sabins, MD  sucralfate (CARAFATE) 1 g tablet Take 1 tablet (1 g total) by mouth 3 (three) times daily as needed. 04/12/19   Betsey Amen, MD    Family History No family history on file.  Social History Social History  Tobacco Use  . Smoking status: Never Smoker  . Smokeless tobacco: Never Used  Substance Use Topics  . Alcohol use: Never    Frequency: Never    Comment: wine occ  . Drug use: Never     Allergies   No known allergies   Review of Systems Review of Systems  Constitutional: Negative for fever.  HENT: Negative for congestion and trouble swallowing.   Eyes: Negative for visual disturbance.  Respiratory: Negative for cough and shortness of breath.   Cardiovascular: Negative for chest pain and leg swelling.   Gastrointestinal: Positive for abdominal pain and nausea. Negative for constipation, diarrhea and vomiting.  Genitourinary: Positive for vaginal bleeding (currently on menses). Negative for dysuria and vaginal discharge.  Musculoskeletal: Positive for back pain.  Skin: Negative for rash.  Neurological: Positive for light-headedness. Negative for syncope, weakness, numbness and headaches.  Psychiatric/Behavioral: Negative for confusion.     Physical Exam Updated Vital Signs BP 118/74 (BP Location: Right Arm)   Pulse 71   Temp 98 F (36.7 C) (Oral)   Resp 18   SpO2 99%   Physical Exam Constitutional:      General: She is not in acute distress.    Appearance: She is not diaphoretic.  HENT:     Head: Normocephalic and atraumatic.     Right Ear: External ear normal.     Left Ear: External ear normal.     Nose: Nose normal.     Mouth/Throat:     Mouth: Mucous membranes are moist.     Pharynx: Oropharynx is clear.  Eyes:     Conjunctiva/sclera: Conjunctivae normal.     Pupils: Pupils are equal, round, and reactive to light.  Neck:     Musculoskeletal: Neck supple.     Comments: Well healed surgical scar at base of anterior neck Cardiovascular:     Rate and Rhythm: Normal rate and regular rhythm.     Pulses: Normal pulses.  Pulmonary:     Effort: Pulmonary effort is normal. No respiratory distress.     Breath sounds: Normal breath sounds. No wheezing, rhonchi or rales.  Chest:     Chest wall: No tenderness.  Abdominal:     Palpations: Abdomen is soft.     Tenderness: There is no abdominal tenderness. There is right CVA tenderness (mild). There is no left CVA tenderness, guarding or rebound.  Musculoskeletal:     Right lower leg: No edema.     Left lower leg: No edema.  Skin:    General: Skin is warm.  Neurological:     General: No focal deficit present.     Mental Status: She is alert and oriented to person, place, and time.     Cranial Nerves: No cranial nerve  deficit.     Sensory: No sensory deficit.     Motor: No weakness.     Coordination: Coordination normal.      ED Treatments / Results  Labs (all labs ordered are listed, but only abnormal results are displayed) Labs Reviewed  BASIC METABOLIC PANEL - Abnormal; Notable for the following components:      Result Value   Glucose, Bld 111 (*)    Calcium 8.6 (*)    All other components within normal limits  CBC - Abnormal; Notable for the following components:   WBC 3.8 (*)    All other components within normal limits  URINALYSIS, ROUTINE W REFLEX MICROSCOPIC - Abnormal; Notable for the following components:   Hgb  urine dipstick SMALL (*)    Bacteria, UA RARE (*)    All other components within normal limits  LIPASE, BLOOD  HEPATIC FUNCTION PANEL  I-STAT BETA HCG BLOOD, ED ([facility], [facility], [facility] ONLY)    EKG None  Radiology Ct Renal Stone Study  Result Date: 04/12/2019 CLINICAL DATA:  Right-sided flank pain EXAM: CT ABDOMEN AND PELVIS WITHOUT CONTRAST TECHNIQUE: Multidetector CT imaging of the abdomen and pelvis was performed following the standard protocol without IV contrast. COMPARISON:  None. FINDINGS: Lower chest: No acute abnormality. Hepatobiliary: No focal liver abnormality is seen. No gallstones, gallbladder wall thickening, or biliary dilatation. Pancreas: Unremarkable. No pancreatic ductal dilatation or surrounding inflammatory changes. Spleen: Normal in size without focal abnormality. Adrenals/Urinary Tract: Adrenal glands are normal. Prominent extrarenal pelvises bilaterally. No ureteral stone. Bladder is normal Stomach/Bowel: Stomach is within normal limits. Appendix not well seen but no right lower quadrant inflammatory process. No evidence of bowel wall thickening, distention, or inflammatory changes. Vascular/Lymphatic: No significant vascular findings are present. No enlarged abdominal or pelvic lymph nodes. Reproductive: Uterus and bilateral adnexa are unremarkable. Other: No  abdominal wall hernia or abnormality. No abdominopelvic ascites. Musculoskeletal: No acute or significant osseous findings. IMPRESSION: Negative. No CT evidence for acute intra-abdominal or pelvic abnormality Electronically Signed   By: Donavan Foil M.D.   On: 04/12/2019 21:08    Procedures Procedures (including critical care time)  Medications Ordered in ED Medications  ondansetron (ZOFRAN-ODT) disintegrating tablet 4 mg (4 mg Oral Given 04/12/19 2002)  alum & mag hydroxide-simeth (MAALOX/MYLANTA) 200-200-20 MG/5ML suspension 15 mL (15 mLs Oral Given 04/12/19 2118)     Initial Impression / Assessment and Plan / ED Course  I have reviewed the triage vital signs and the nursing notes.  Pertinent labs & imaging results that were available during my care of the patient were reviewed by me and considered in my medical decision making (see chart for details).       Concern for possible nephrolithiasis versus pancreatitis versus PUD versus pyelonephritis.  Negative pregnancy test.  Laboratory studies unrevealing.  Patient without pelvic or lower abdominal complaints, defer pelvic exam at this time.  Patient given Zofran and Maalox for her symptoms with some improvement.  CT of the abdomen and pelvis without contrast obtained which was unrevealing.  Patient able to ambulate without difficulty. Advised taking omeprazole daily.  Patient given prescription for Zofran for nausea and Carafate.  All questions answered.  Patient comfortable with plan to discharge home, continue supportive care, and closely follow-up as an outpatient.  Patient seen and plan discussed with Dr. Sedonia Small.  Final Clinical Impressions(s) / ED Diagnoses   Final diagnoses:  Epigastric pain  Nausea  Bilateral back pain, unspecified back location, unspecified chronicity    ED Discharge Orders         Ordered    sucralfate (CARAFATE) 1 g tablet  3 times daily PRN     04/12/19 2231    ondansetron (ZOFRAN ODT) 4 MG  disintegrating tablet  Every 8 hours PRN     04/12/19 2233           Betsey Amen, MD 04/13/19 0101    Maudie Flakes, MD 04/13/19 2107

## 2019-04-26 ENCOUNTER — Other Ambulatory Visit: Payer: Self-pay | Admitting: Endocrinology

## 2019-04-26 DIAGNOSIS — N6310 Unspecified lump in the right breast, unspecified quadrant: Secondary | ICD-10-CM

## 2019-04-29 ENCOUNTER — Other Ambulatory Visit: Payer: Self-pay

## 2019-04-29 ENCOUNTER — Ambulatory Visit
Admission: RE | Admit: 2019-04-29 | Discharge: 2019-04-29 | Disposition: A | Discharge status: Home / Self Care | Payer: No Typology Code available for payment source | Source: Ambulatory Visit | Attending: Endocrinology | Admitting: Endocrinology

## 2019-04-29 ENCOUNTER — Other Ambulatory Visit: Payer: Self-pay | Admitting: Endocrinology

## 2019-04-29 DIAGNOSIS — N6310 Unspecified lump in the right breast, unspecified quadrant: Secondary | ICD-10-CM

## 2019-04-29 DIAGNOSIS — N6489 Other specified disorders of breast: Secondary | ICD-10-CM

## 2019-04-29 IMAGING — US US BREAST*R* LIMITED INC AXILLA
2 series · 7 of 7 positions shown · non-contrast
Comparison: None.

CLINICAL DATA: 33-year-old female presenting for evaluation of a
palpable lump with tenderness in the right breast for 1 week.

EXAM:
DIGITAL DIAGNOSTIC BILATERAL MAMMOGRAM WITH CAD AND TOMO
ULTRASOUND RIGHT BREAST

[Series 1: us breast*right* limited inc axilla · 0.06mm/px · 5 of 5 slices shown (1 of 2)]
[im 1/5]
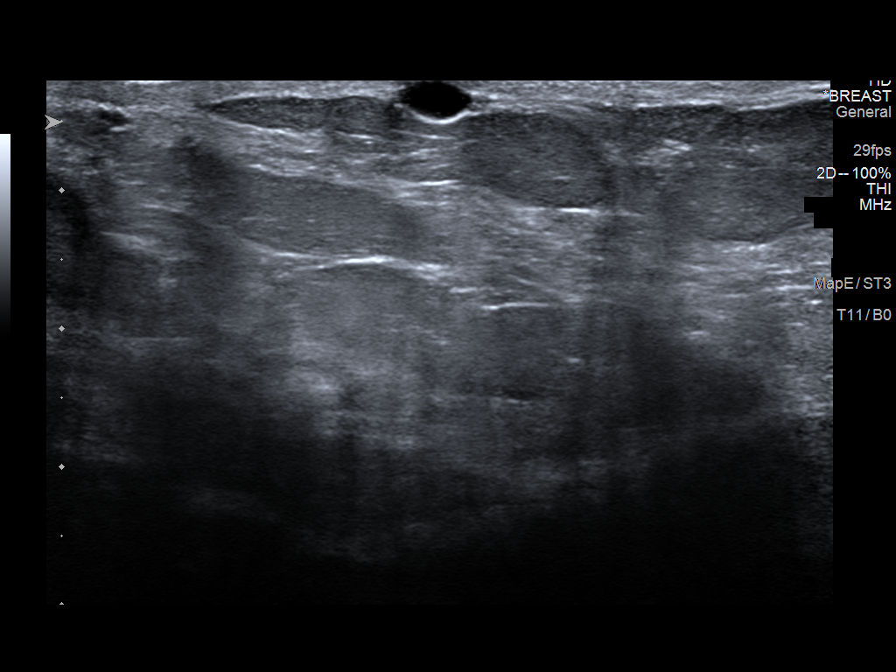
[im 2/5]
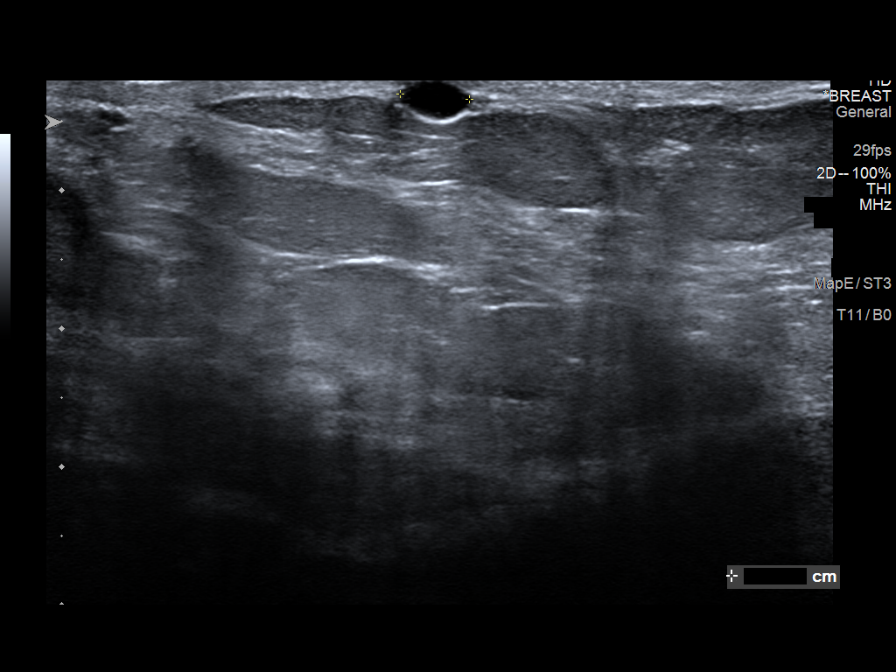
[im 3/5]
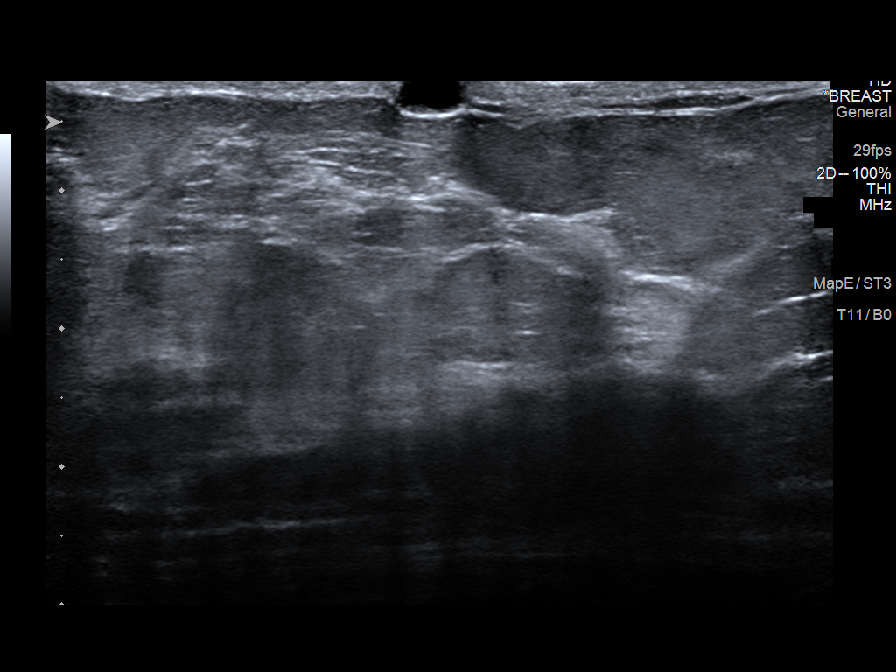
[im 4/5]
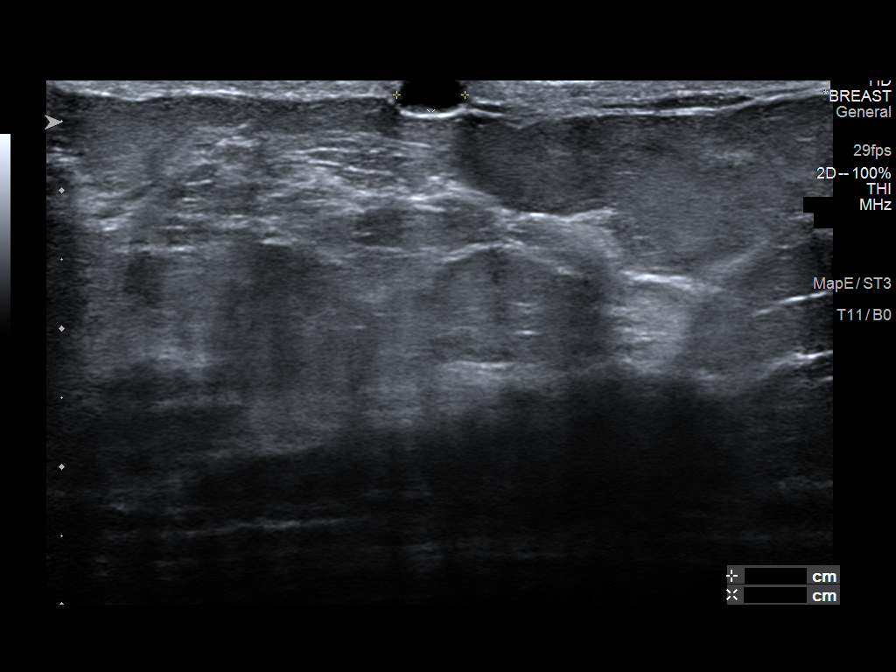
[im 5/5]
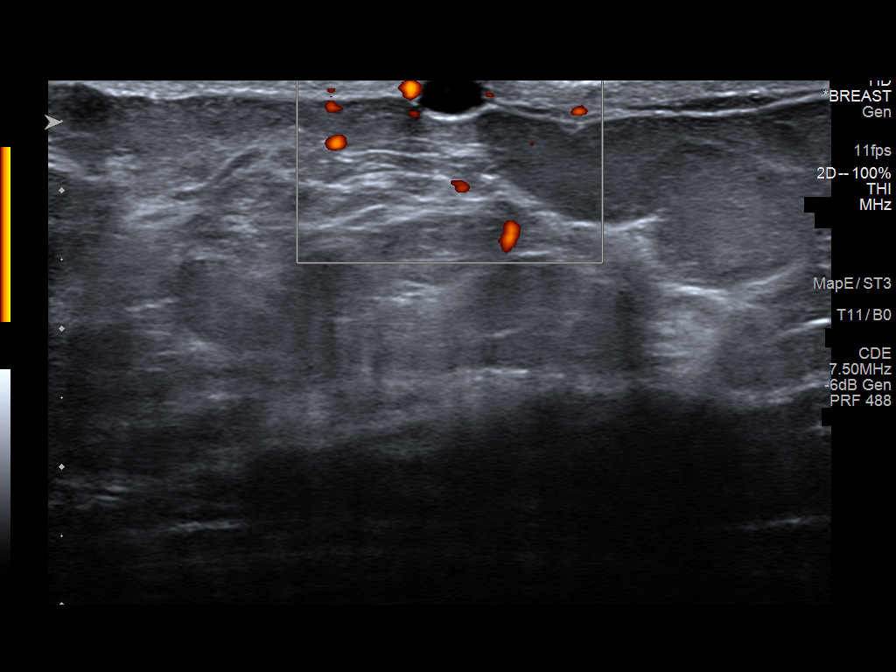

[Series 2: us breast*right* limited inc axilla · 0.06mm/px · 2 of 2 slices shown (2 of 2)]
[im 1/2]
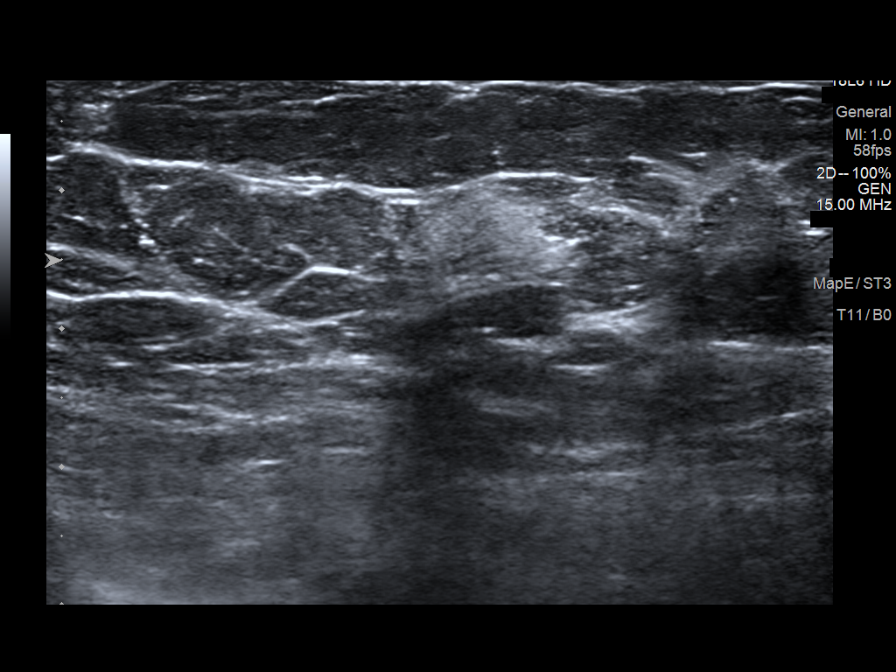
[im 2/2]
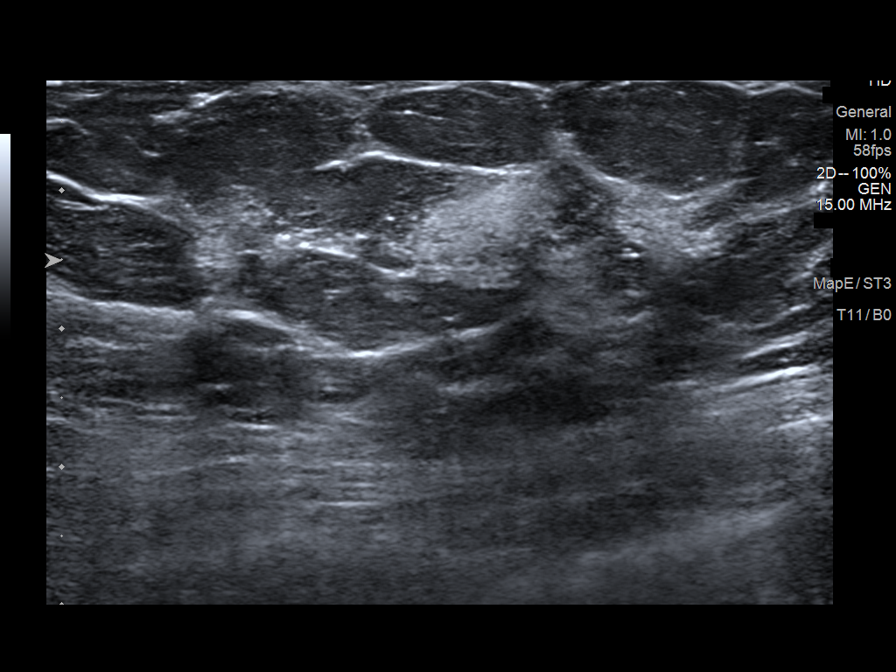

[7 of 7 positions shown; findings below may reference images not displayed]

ACR Breast Density Category c: The breast tissue is heterogeneously
dense, which may obscure small masses.
FINDINGS: Spot compression tomosynthesis images of the palpable lump in the
periareolar right breast demonstrates a small circumscribed
superficial mass measuring about 5 mm. There is also an asymmetry in
the lower-inner quadrant of the right breast. No associated
distortion or underlying mass is seen on spot compression
tomosynthesis imaging. No suspicious calcifications, masses or areas
of distortion are seen in the bilateral breasts.

Mammographic images were processed with CAD.

Ultrasound of the right breast at 7 o'clock, 3 cm from the nipple
demonstrates an anechoic mass within the skin measuring 5 x 3 x 5
mm. Ultrasound of the lower-inner quadrant demonstrates no other
masses, or suspicious areas of shadowing.
IMPRESSION: 1. The palpable lump in the right breast corresponds with a benign
epidermal cyst.

2. There is an asymmetry in the lower-inner quadrant of the right
breast without a sonographic correlate. This likely benign.

3.  No mammographic evidence of malignancy in the bilateral breasts.

RECOMMENDATION:
Six-month follow-up diagnostic right breast mammogram.

I have discussed the findings and recommendations with the patient.
If applicable, a reminder letter will be sent to the patient
regarding the next appointment.

BI-RADS CATEGORY  3: Probably benign.

## 2019-07-07 ENCOUNTER — Other Ambulatory Visit: Payer: Self-pay | Admitting: Family Medicine

## 2019-07-07 ENCOUNTER — Other Ambulatory Visit (HOSPITAL_COMMUNITY)
Admission: RE | Admit: 2019-07-07 | Discharge: 2019-07-07 | Disposition: A | Discharge status: Home / Self Care | Payer: No Typology Code available for payment source | Source: Ambulatory Visit | Attending: Family Medicine | Admitting: Family Medicine

## 2019-07-07 DIAGNOSIS — Z0000 Encounter for general adult medical examination without abnormal findings: Secondary | ICD-10-CM | POA: Diagnosis not present

## 2019-07-09 ENCOUNTER — Other Ambulatory Visit: Payer: Self-pay | Admitting: Family Medicine

## 2019-07-09 ENCOUNTER — Ambulatory Visit
Admission: RE | Admit: 2019-07-09 | Discharge: 2019-07-09 | Disposition: A | Discharge status: Home / Self Care | Payer: No Typology Code available for payment source | Source: Ambulatory Visit | Attending: Family Medicine | Admitting: Family Medicine

## 2019-07-09 DIAGNOSIS — M545 Low back pain, unspecified: Secondary | ICD-10-CM

## 2019-07-09 DIAGNOSIS — Z87442 Personal history of urinary calculi: Secondary | ICD-10-CM

## 2019-07-12 LAB — CYTOLOGY - PAP
Chlamydia: NEGATIVE
Comment: NEGATIVE
Comment: NEGATIVE
Comment: NORMAL
Diagnosis: NEGATIVE
Diagnosis: REACTIVE
High risk HPV: NEGATIVE
Neisseria Gonorrhea: NEGATIVE

## 2019-09-10 ENCOUNTER — Ambulatory Visit: Payer: No Typology Code available for payment source | Attending: Internal Medicine

## 2019-09-10 DIAGNOSIS — Z23 Encounter for immunization: Secondary | ICD-10-CM

## 2019-09-10 NOTE — Progress Notes (Signed)
   Covid-19 Vaccination Clinic  Name:  Gina Sullivan    MRN: [id number] DOB: 09/12/1985  09/10/2019  [name] [name] was observed post Covid-19 immunization for 15 minutes without incidence. She was provided with Vaccine Information Sheet and instruction to access the V-Safe system.   [name] was instructed to call 911 with any severe reactions post vaccine: Marland Kitchen Difficulty breathing  . Swelling of your face and throat  . A fast heartbeat  . A bad rash all over your body  . Dizziness and weakness    Immunizations Administered    Name Date Dose VIS Date Route   Pfizer COVID-19 Vaccine 09/10/2019  5:56 PM 0.3 mL 07/09/2019 Intramuscular   Manufacturer: [place]   Lot: [id number]   [place]: [id number]

## 2019-10-01 ENCOUNTER — Ambulatory Visit: Payer: No Typology Code available for payment source | Attending: Internal Medicine

## 2019-10-01 DIAGNOSIS — Z23 Encounter for immunization: Secondary | ICD-10-CM | POA: Insufficient documentation

## 2019-10-01 NOTE — Progress Notes (Signed)
   Covid-19 Vaccination Clinic  Name:  Gina Sullivan    MRN: [id number] DOB: [birth year]  10/01/2019  [name] Gina Simpers was observed post Covid-19 immunization for 15 minutes without incident. She was provided with Vaccine Information Sheet and instruction to access the V-Safe system.   Ms. Gina Sullivan was instructed to call 911 with any severe reactions post vaccine: Marland Kitchen Difficulty breathing  . Swelling of face and throat  . A fast heartbeat  . A bad rash all over body  . Dizziness and weakness   Immunizations Administered    Name Date Dose VIS Date Route   Pfizer COVID-19 Vaccine 10/01/2019  5:45 PM 0.3 mL 07/09/2019 Intramuscular   Manufacturer: [place]   Lot: [id number]   [place]: [id number]

## 2019-10-28 ENCOUNTER — Other Ambulatory Visit: Payer: Self-pay

## 2019-10-28 ENCOUNTER — Ambulatory Visit
Admission: RE | Admit: 2019-10-28 | Discharge: 2019-10-28 | Disposition: A | Discharge status: Home / Self Care | Payer: No Typology Code available for payment source | Source: Ambulatory Visit | Attending: Endocrinology | Admitting: Endocrinology

## 2019-10-28 DIAGNOSIS — N6489 Other specified disorders of breast: Secondary | ICD-10-CM

## 2019-10-28 IMAGING — MG MM DIGITAL DIAGNOSTIC UNILAT*R* W/ TOMO W/ CAD
4 series · 4 of 12 positions shown · non-contrast
Comparison: Previous exam(s).

CLINICAL DATA: 34-year-old female presenting for six-month
follow-up of a probably benign right breast asymmetry.

EXAM:
DIGITAL DIAGNOSTIC UNILATERAL RIGHT MAMMOGRAM WITH CAD AND TOMO

[R CC synth-2D]
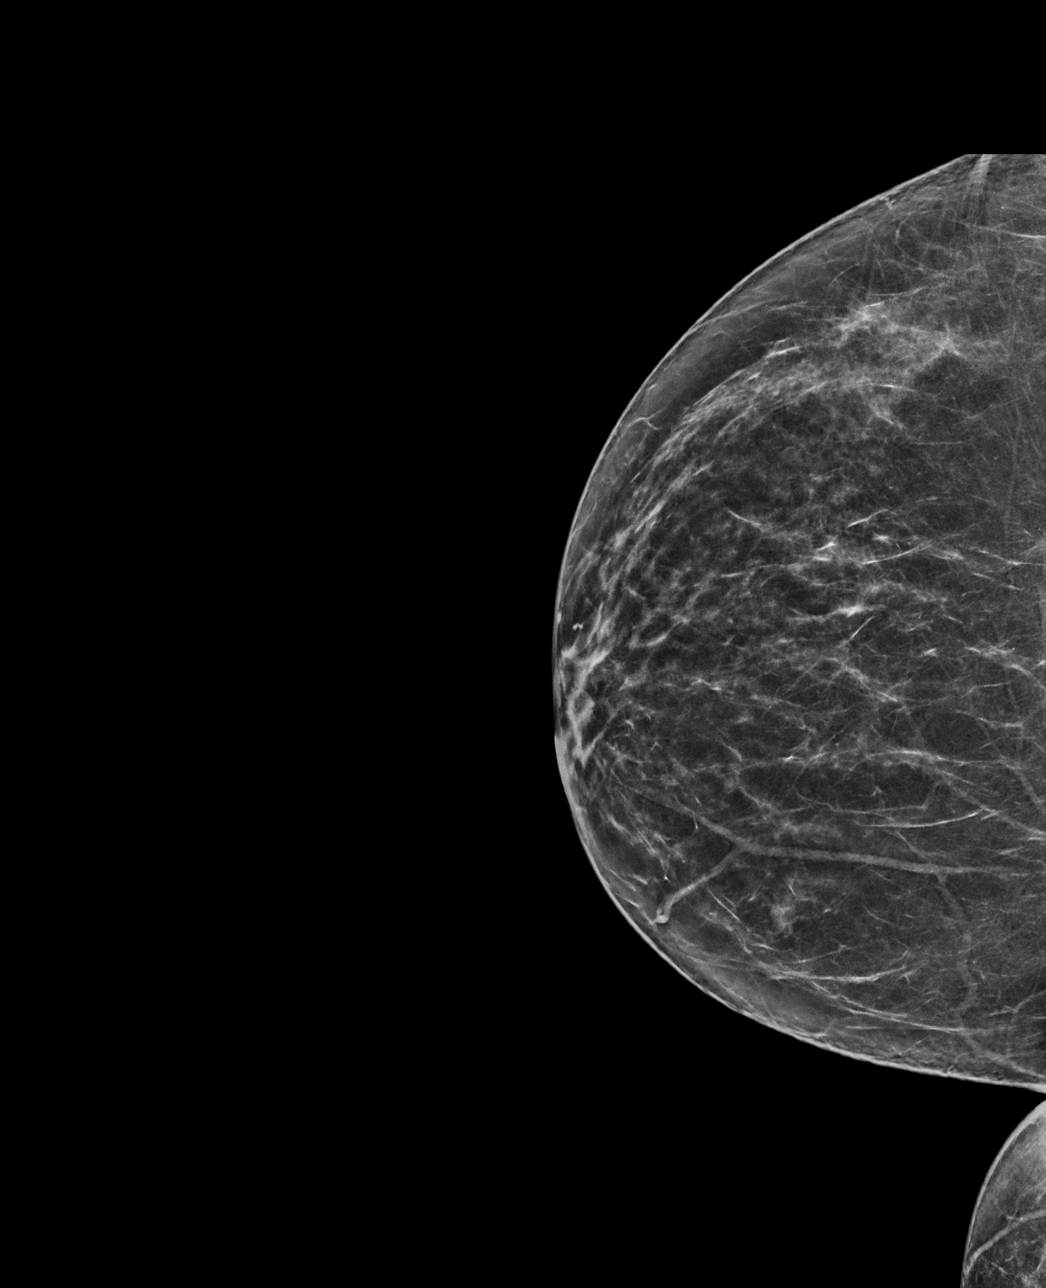

[R MLO synth-2D]
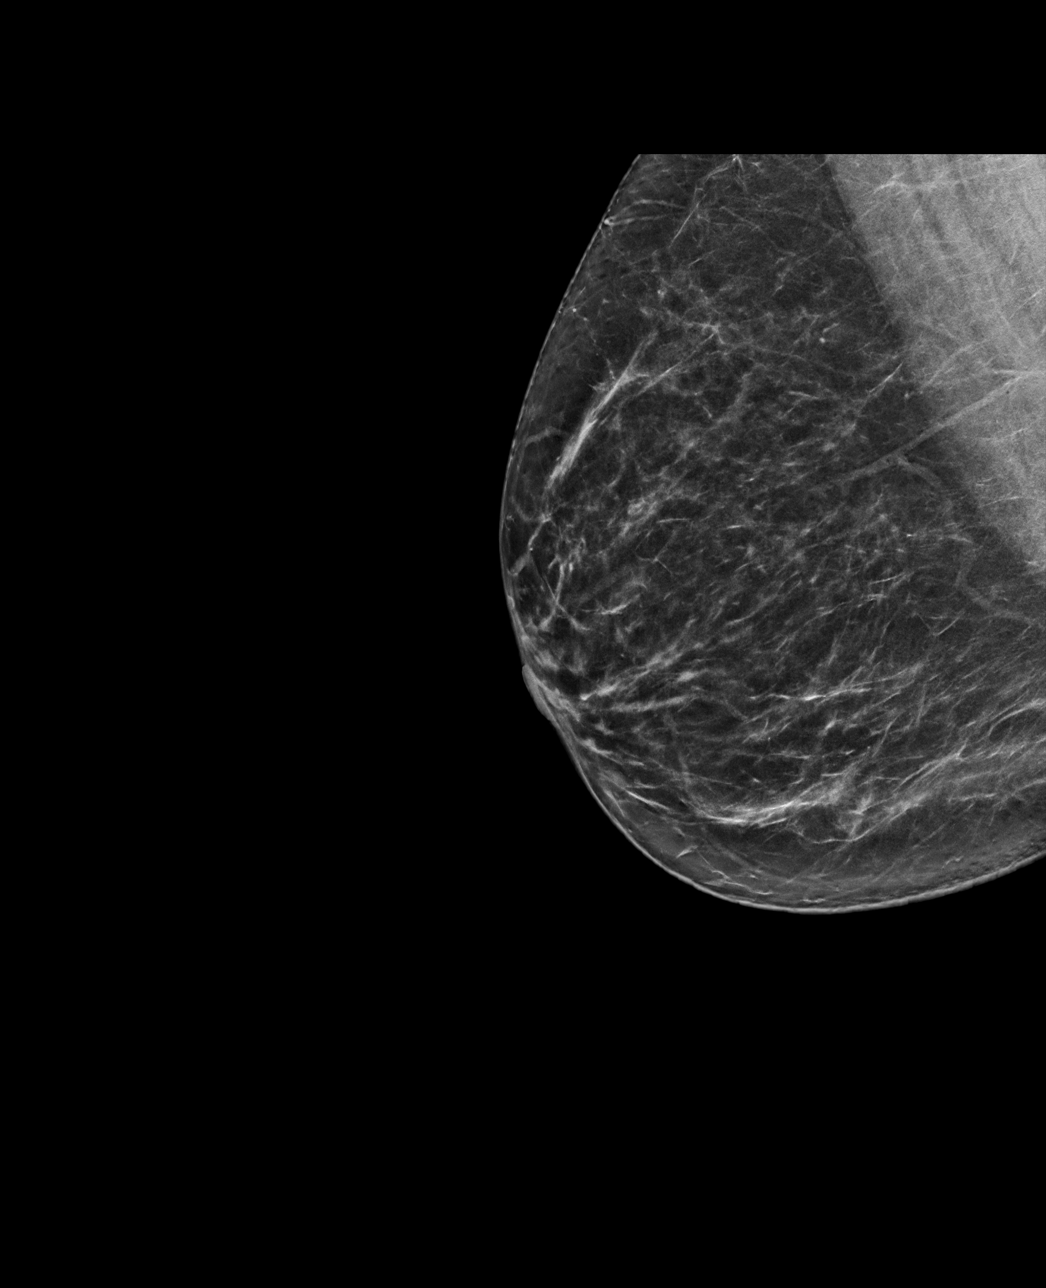

[R CC tomo · tomo slice 33/65.0]
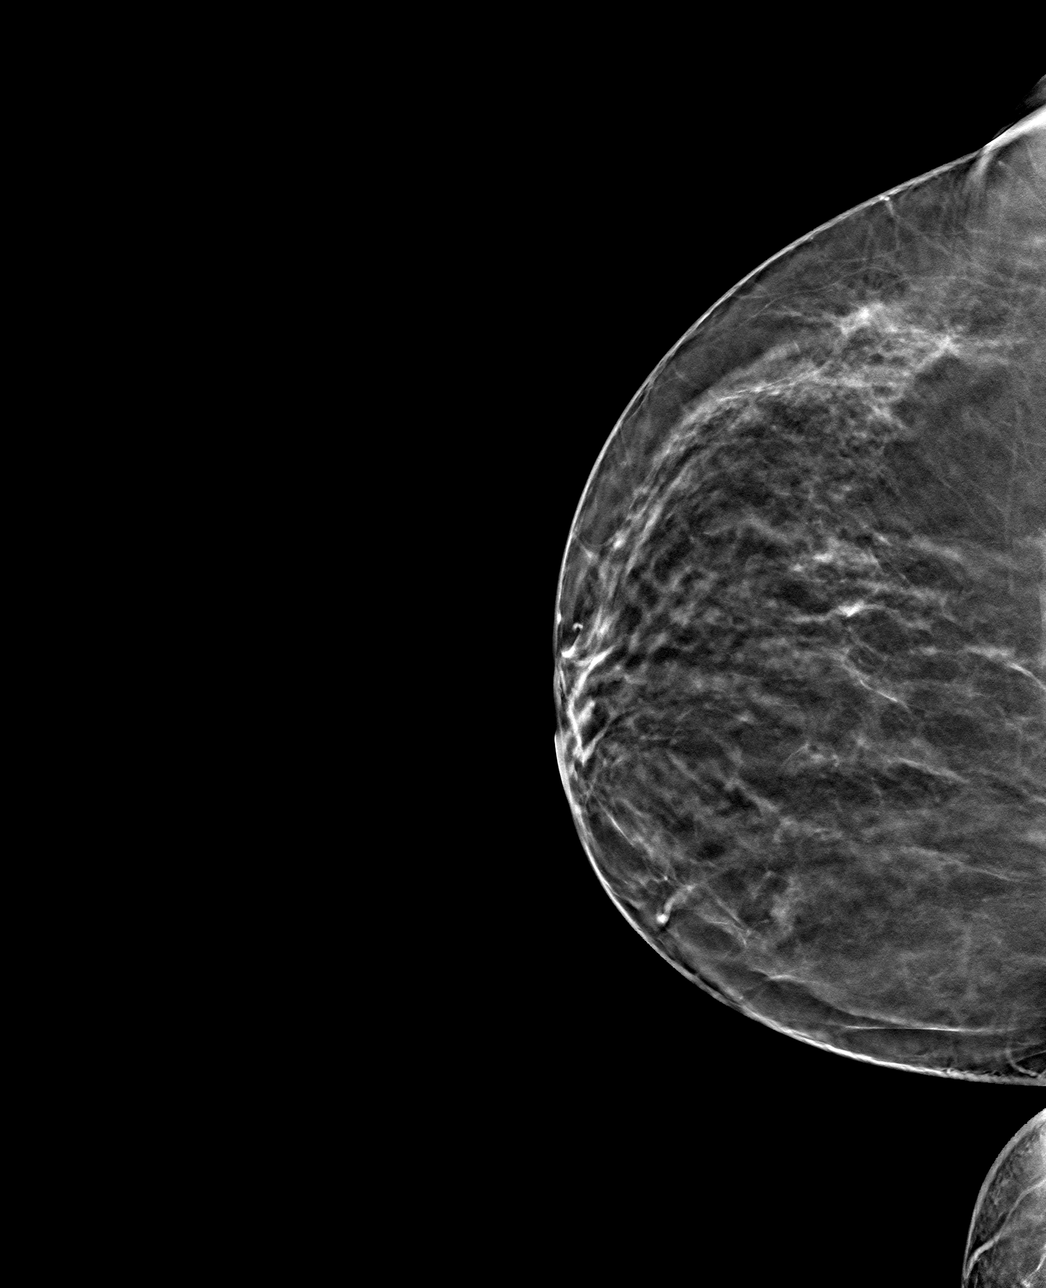

[R MLO tomo · tomo slice 35/70.0]
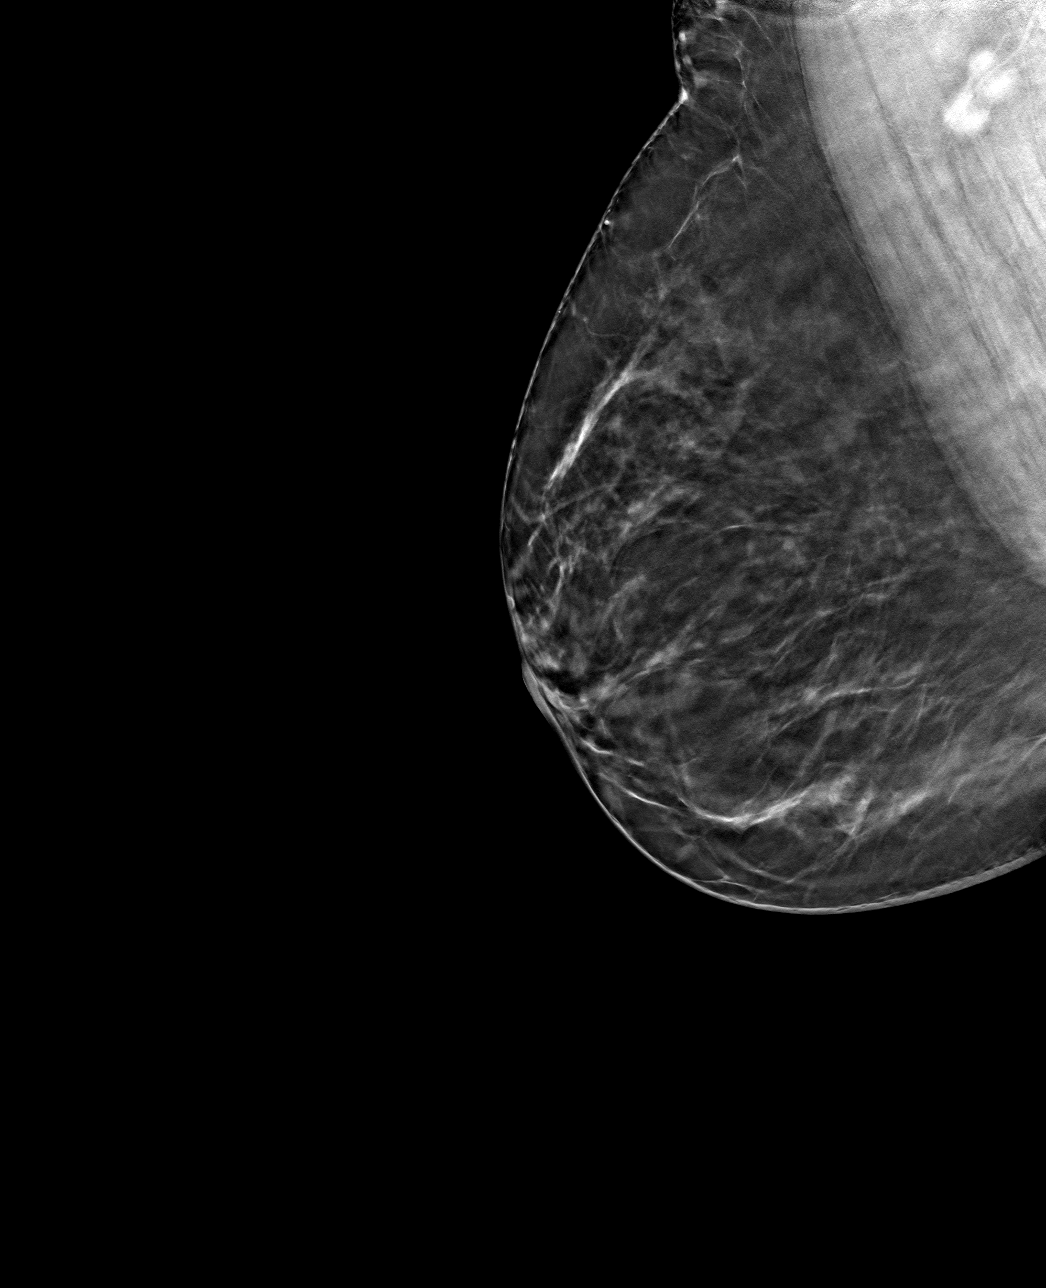

[4 of 12 positions shown; findings below may reference images not displayed]

ACR Breast Density Category b: There are scattered areas of
fibroglandular density.
FINDINGS: Full field tomosynthesis views were performed of the right breast
for the probably benign asymmetry in the medial breast. There is
again an asymmetry measuring 0.5 cm seen only on the CC view which
is unchanged from the prior study. No new findings in the right
breast.

Mammographic images were processed with CAD.
IMPRESSION: Probably benign asymmetry in the medial right breast. No sonographic
correlate was seen on the previous exam.

RECOMMENDATION:
Diagnostic bilateral mammogram in 6 months.

I have discussed the findings and recommendations with the patient.
If applicable, a reminder letter will be sent to the patient
regarding the next appointment.

BI-RADS CATEGORY  3: Probably benign.

## 2020-03-06 ENCOUNTER — Other Ambulatory Visit: Payer: Self-pay | Admitting: Endocrinology

## 2020-03-06 DIAGNOSIS — N6489 Other specified disorders of breast: Secondary | ICD-10-CM

## 2020-03-09 ENCOUNTER — Other Ambulatory Visit: Payer: Self-pay | Admitting: Endocrinology

## 2020-03-09 DIAGNOSIS — N644 Mastodynia: Secondary | ICD-10-CM

## 2020-03-29 ENCOUNTER — Ambulatory Visit: Payer: No Typology Code available for payment source

## 2020-03-29 ENCOUNTER — Ambulatory Visit
Admission: RE | Admit: 2020-03-29 | Discharge: 2020-03-29 | Disposition: A | Discharge status: Home / Self Care | Payer: No Typology Code available for payment source | Source: Ambulatory Visit | Attending: Endocrinology | Admitting: Endocrinology

## 2020-03-29 ENCOUNTER — Other Ambulatory Visit: Payer: Self-pay

## 2020-03-29 DIAGNOSIS — N644 Mastodynia: Secondary | ICD-10-CM

## 2020-03-29 IMAGING — MG DIGITAL DIAGNOSTIC BILAT W/ TOMO W/ CAD
8 series · 8 of 24 positions shown · non-contrast
Comparison: Previous exam(s).

CLINICAL DATA: 34-year-old female with diffuse, intermittent
bilateral breast pain for several months. The patient is currently
being followed for a probably benign right breast asymmetry.

EXAM:
DIGITAL DIAGNOSTIC BILATERAL MAMMOGRAM WITH TOMO AND CAD

[L MLO synth-2D]
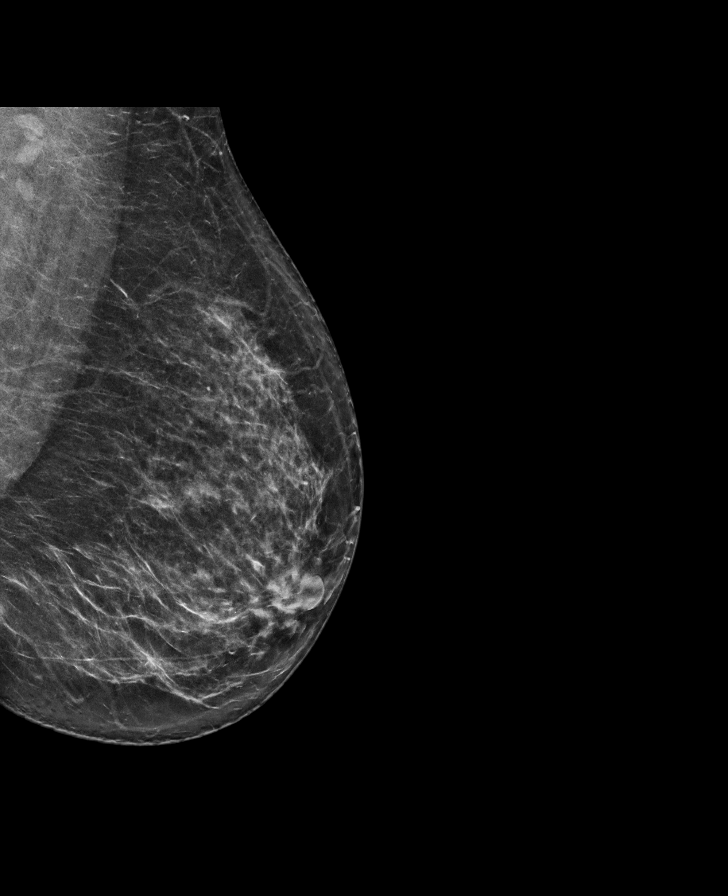

[R CC synth-2D]
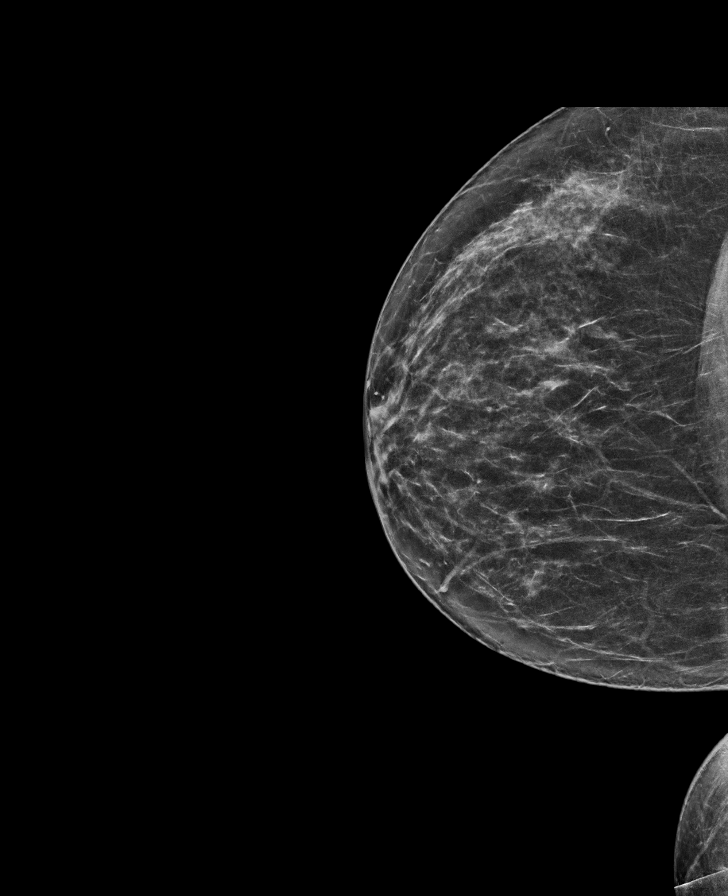

[R MLO synth-2D]
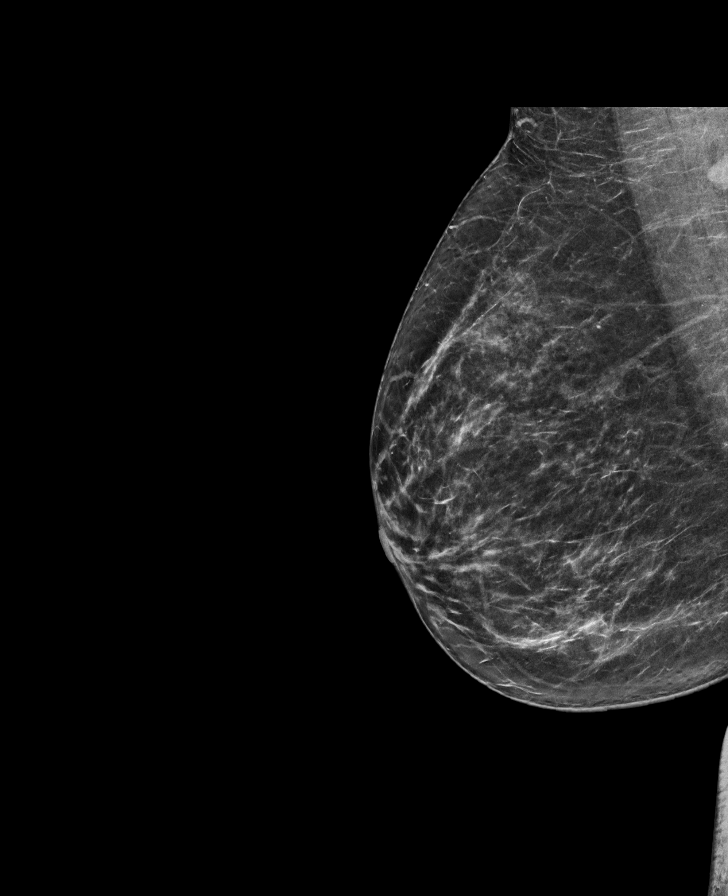

[L CC synth-2D]
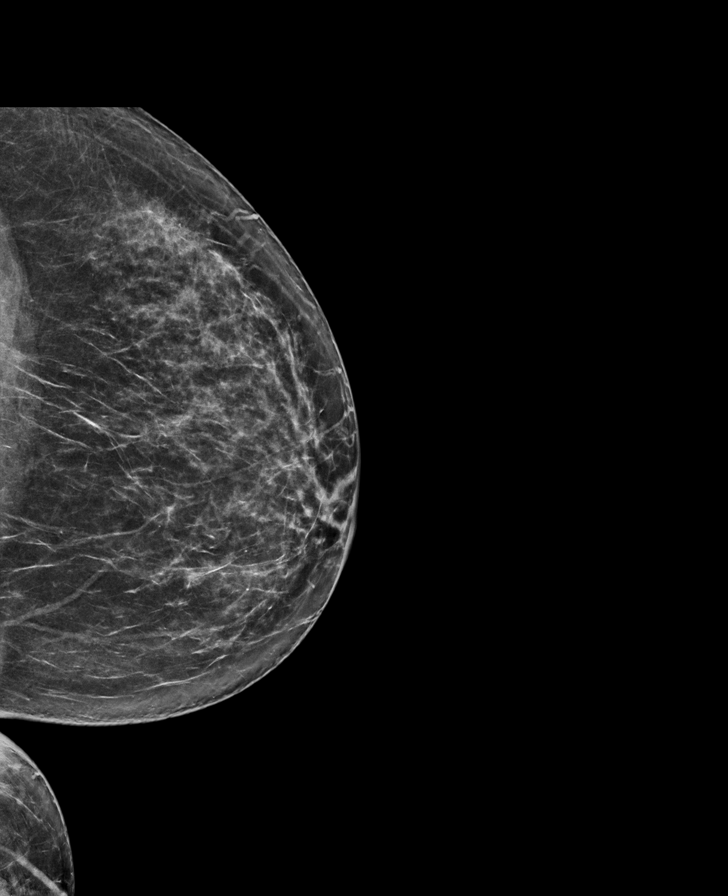

[L MLO tomo · tomo slice 39/76.0]
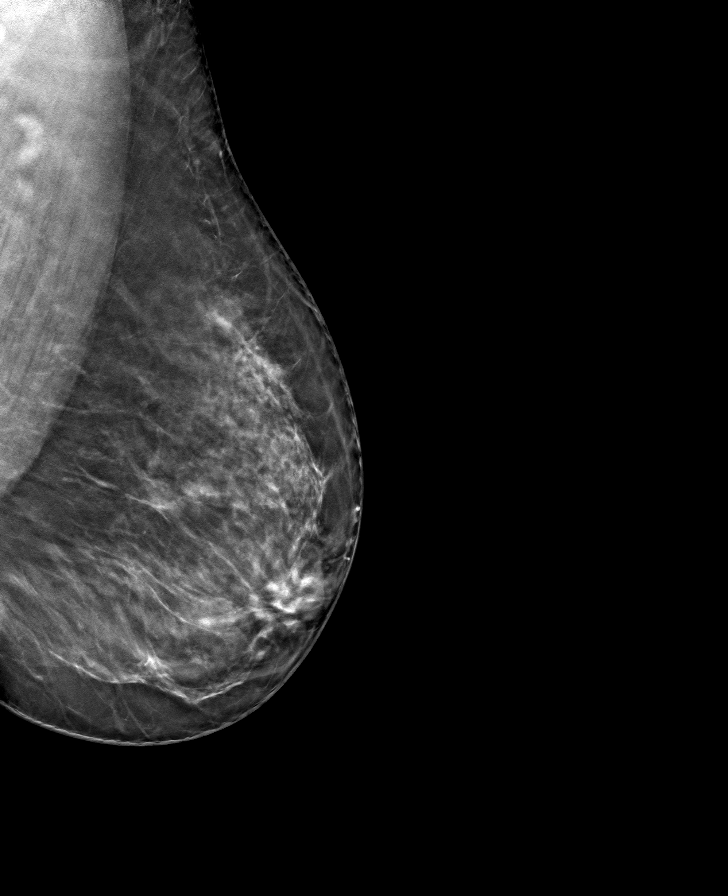

[R CC tomo · tomo slice 33/66.0]
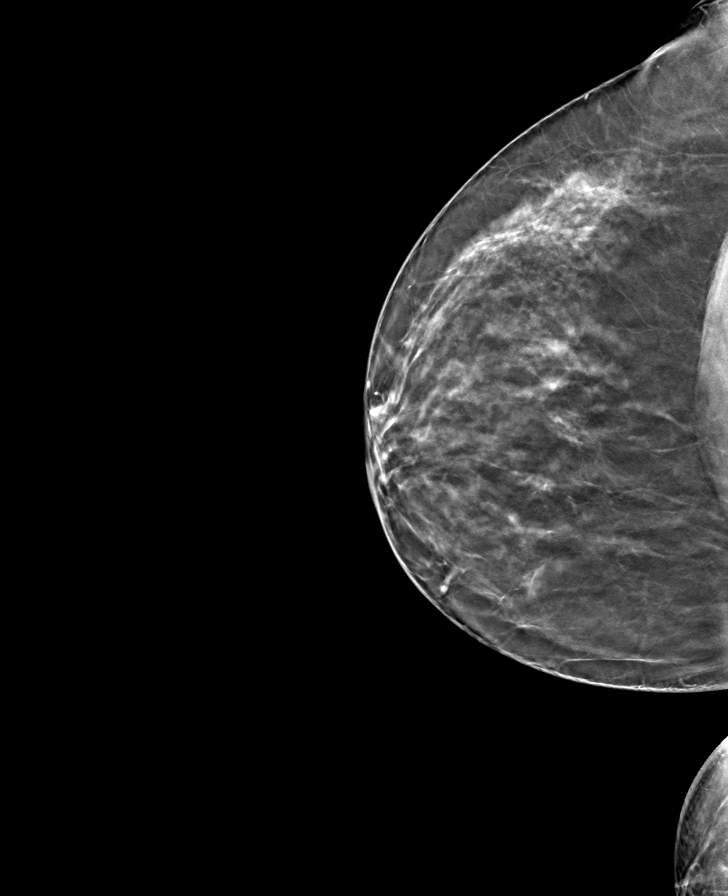

[L CC tomo · tomo slice 35/70.0]
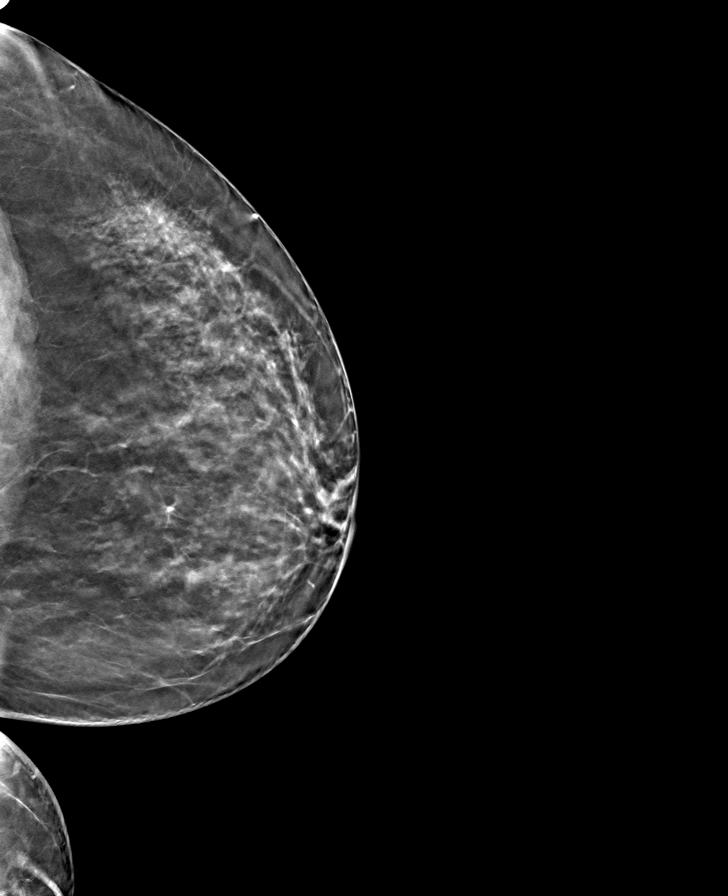

[R MLO tomo · tomo slice 35/69.0]
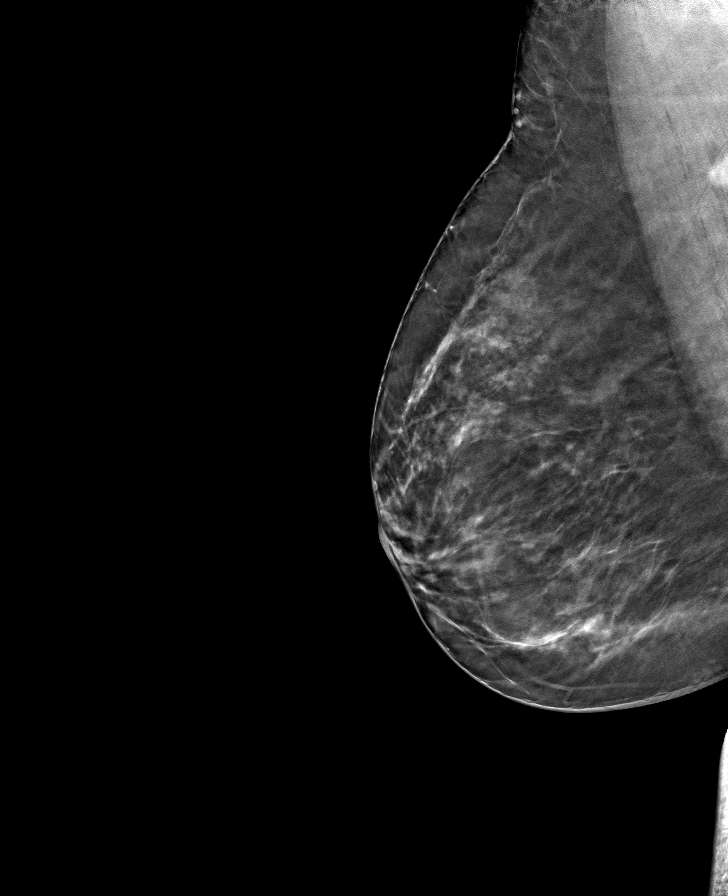

[8 of 24 positions shown; findings below may reference images not displayed]

ACR Breast Density Category b: There are scattered areas of
fibroglandular density.
FINDINGS: An asymmetry in the medial right breast at middle depth is seen on
the cc projection is mammographically stable. No new or suspicious
findings are identified in either breast. The parenchymal pattern is
stable.

Mammographic images were processed with CAD.
IMPRESSION: 1. Stable, probably benign right breast asymmetry. Recommend
continued imaging follow-up in 1 year.
2. No other suspicious mammographic findings in the remainder of
either breast.

RECOMMENDATION:
1. Clinical follow-up recommended for the diffusely painful areas of
concern in the bilateral breasts. Any further workup should be based
on clinical grounds. Benign causes of breast pain, and possible
remedies, were discussed with the patient. Patient was encouraged to
follow-up with referring physician if pain became localized and
persistent or if a palpable lump/mass developed.
2. Bilateral diagnostic mammogram in 1 year.

I have discussed the findings and recommendations with the patient.
If applicable, a reminder letter will be sent to the patient
regarding the next appointment.

BI-RADS CATEGORY  3: Probably benign.

## 2020-05-15 ENCOUNTER — Other Ambulatory Visit: Payer: Self-pay | Admitting: Endocrinology

## 2020-06-20 ENCOUNTER — Ambulatory Visit: Payer: No Typology Code available for payment source | Admitting: Obstetrics & Gynecology

## 2020-06-20 ENCOUNTER — Encounter: Payer: Self-pay | Admitting: Obstetrics & Gynecology

## 2020-06-20 ENCOUNTER — Other Ambulatory Visit: Payer: Self-pay

## 2020-06-20 VITALS — BP 124/80 | Ht 64.0 in | Wt 176.0 lb

## 2020-06-20 DIAGNOSIS — Z113 Encounter for screening for infections with a predominantly sexual mode of transmission: Secondary | ICD-10-CM

## 2020-06-20 DIAGNOSIS — N979 Female infertility, unspecified: Secondary | ICD-10-CM

## 2020-06-20 DIAGNOSIS — N898 Other specified noninflammatory disorders of vagina: Secondary | ICD-10-CM | POA: Diagnosis not present

## 2020-06-20 DIAGNOSIS — Z01419 Encounter for gynecological examination (general) (routine) without abnormal findings: Secondary | ICD-10-CM | POA: Diagnosis not present

## 2020-06-20 DIAGNOSIS — Z1151 Encounter for screening for human papillomavirus (HPV): Secondary | ICD-10-CM

## 2020-06-20 DIAGNOSIS — Z85850 Personal history of malignant neoplasm of thyroid: Secondary | ICD-10-CM

## 2020-06-20 DIAGNOSIS — E890 Postprocedural hypothyroidism: Secondary | ICD-10-CM | POA: Diagnosis not present

## 2020-06-20 DIAGNOSIS — C73 Malignant neoplasm of thyroid gland: Secondary | ICD-10-CM

## 2020-06-20 LAB — WET PREP FOR TRICH, YEAST, CLUE

## 2020-06-20 MED ORDER — TINIDAZOLE 500 MG PO TABS: 1000.0000 mg | ORAL_TABLET | Freq: Two times a day (BID) | ORAL | 0 refills | Status: AC

## 2020-06-20 NOTE — Addendum Note (Signed)
Addended by: Thurnell Garbe A on: 06/20/2020 11:12 AM   Modules accepted: Orders

## 2020-06-20 NOTE — Progress Notes (Signed)
Gina Sullivan 16-Jun-1986 [id number]   History:    34 y.o. [id number] Married  RP:  New patient presenting for annual gyn exam   HPI: Menses regular every month with normal flow.  No BTB.  No pelvic pain.  Odor with vaginal discharge.  Attempting conception x 3 yrs.  Had a first trimester complete spontaneous abortion 12 yrs ago with another partner.  No complication.  Husband has a 55 yo child.  Patient with Hypothyroidism on Synthroid 0.175 microgram daily because of H/O Thyroid Ca 4 yrs ago, status post Thyroidectomy, followed by Dr Chalmers Cater.  Last TSH was high, increased her Synthroid.  Breasts normal.  No nipple discharge.  BMI 30.21.  Not very physically active.  Health labs with Fam MD.  Past medical history,surgical history, family history and social history were all reviewed and documented in the EPIC chart.  Gynecologic History Patient's last menstrual period was 06/10/2020.  Obstetric History OB History  Gravida Para Term Preterm AB Living  1 0     1 0  SAB TAB Ectopic Multiple Live Births  1            # Outcome Date GA Lbr Len/2nd Weight Sex Delivery Anes PTL Lv  1 SAB              ROS: A ROS was performed and pertinent positives and negatives are included in the history.  GENERAL: No fevers or chills. HEENT: No change in vision, no earache, sore throat or sinus congestion. NECK: No pain or stiffness. CARDIOVASCULAR: No chest pain or pressure. No palpitations. PULMONARY: No shortness of breath, cough or wheeze. GASTROINTESTINAL: No abdominal pain, nausea, vomiting or diarrhea, melena or bright red blood per rectum. GENITOURINARY: No urinary frequency, urgency, hesitancy or dysuria. MUSCULOSKELETAL: No joint or muscle pain, no back pain, no recent trauma. DERMATOLOGIC: No rash, no itching, no lesions. ENDOCRINE: No polyuria, polydipsia, no heat or cold intolerance. No recent change in weight. HEMATOLOGICAL: No anemia or easy bruising or bleeding. NEUROLOGIC: No headache,  seizures, numbness, tingling or weakness. PSYCHIATRIC: No depression, no loss of interest in normal activity or change in sleep pattern.     Exam:   BP 124/80   Ht 5\' 4"  (1.626 m)   Wt 176 lb (79.8 kg)   LMP 06/10/2020 Comment: no birth control   BMI 30.21 kg/m   Body mass index is 30.21 kg/m.  General appearance : Well developed well nourished female. No acute distress HEENT: Eyes: no retinal hemorrhage or exudates,  Neck supple, trachea midline, no carotid bruits, no thyroidmegaly Lungs: Clear to auscultation, no rhonchi or wheezes, or rib retractions  Heart: Regular rate and rhythm, no murmurs or gallops Breast:Examined in sitting and supine position were symmetrical in appearance, no palpable masses or tenderness,  no skin retraction, no nipple inversion, no nipple discharge, no skin discoloration, no axillary or supraclavicular lymphadenopathy Abdomen: no palpable masses or tenderness, no rebound or guarding Extremities: no edema or skin discoloration or tenderness  Pelvic: Vulva: Normal             Vagina: No gross lesions.  Increased discharge.  Wet prep done.  Cervix: No gross lesions or discharge.  Pap/HPV HR, Gono-Chlam done.   Uterus  AV, normal size, shape and consistency, non-tender and mobile  Adnexa  Without masses or tenderness  Anus: Normal  Wet prep:  Clue cells present   Assessment/Plan:  34 y.o. female for annual exam   1. Encounter  for routine gynecological examination with Papanicolaou smear of cervix Normal gynecologic exam.  Pap test done.  Breast exam normal.  Body mass index 30.21.  Recommend a lower calorie/carb diet.  Increase fitness.  Health labs with family physician.  2. Primary female infertility Primary infertility for 3 years.  History of first trimester complete spontaneous abortion 12 years ago.  No complication.  Hypothyroidism post thyroidectomy for thyroid cancer.  Adjusting her Synthroid dosage.  Will verify ovulation with a day 21  progesterone on December 3.  Will check TSH and prolactin as well.  No history of STD.  Gonorrhea and Chlamydia done today.  Husband has a previous paternity with a 75-year-old.  We will follow-up after the labs to decide on fertility management.  Start back on prenatal vitamins. - TSH - Prolactin - Progesterone  3. Vaginal odor Bacterial Vaginosis confirmed by Wet Prep.  Will treat with tinidazole 2 tablets twice a day for 2 days.  Usage reviewed and prescription sent to pharmacy.  4. Screen for STD (sexually transmitted disease) Gono-Chlam done on Pap.  5. Thyroid cancer ([place]) S/P Thyroidectomy.  Followed by Dr Chalmers Cater.  6. Postoperative hypothyroidism On Synthroid.  Currently adjusting her dosage, increased Synthroid to 0.175 microgram daily in 04/2020. - TSH  Other orders - levothyroxine (SYNTHROID) 175 MCG tablet; Take 175 mcg by mouth daily before breakfast. - tinidazole (TINDAMAX) 500 MG tablet; Take 2 tablets (1,000 mg total) by mouth 2 (two) times daily for 2 days.  Princess Bruins MD, 10:04 AM 06/20/2020

## 2020-06-20 NOTE — Addendum Note (Signed)
Addended by: Thurnell Garbe A on: 06/20/2020 11:00 AM   Modules accepted: Orders

## 2020-06-23 LAB — PAP IG, CT-NG NAA, HPV HIGH-RISK
C. trachomatis RNA, TMA: NOT DETECTED
HPV DNA High Risk: NOT DETECTED
N. gonorrhoeae RNA, TMA: NOT DETECTED

## 2020-06-30 ENCOUNTER — Other Ambulatory Visit: Payer: Self-pay

## 2020-06-30 ENCOUNTER — Other Ambulatory Visit: Payer: No Typology Code available for payment source

## 2020-07-04 ENCOUNTER — Other Ambulatory Visit: Payer: Self-pay

## 2020-07-04 ENCOUNTER — Encounter: Payer: Self-pay | Admitting: Obstetrics & Gynecology

## 2020-07-04 ENCOUNTER — Telehealth: Payer: Self-pay | Admitting: *Deleted

## 2020-07-04 ENCOUNTER — Ambulatory Visit (INDEPENDENT_AMBULATORY_CARE_PROVIDER_SITE_OTHER): Payer: No Typology Code available for payment source | Admitting: Obstetrics & Gynecology

## 2020-07-04 DIAGNOSIS — N979 Female infertility, unspecified: Secondary | ICD-10-CM

## 2020-07-04 NOTE — Telephone Encounter (Signed)
Order faxed to [facility] they will call to scheduled semen analysis. Patient will call on day 1 of cycle to schedule HSG.

## 2020-07-04 NOTE — Progress Notes (Signed)
    [place] 1985-08-10 [id number]        34 y.o.  G1P0A1 Married  RP: Fertility counseling and management  HPI:  Last visit on 06/20/2020 we noted: Menses regular every month with normal flow.  No BTB.  No pelvic pain.  Odor with vaginal discharge.  Attempting conception x 3 yrs.  Had a first trimester complete spontaneous abortion 12 yrs ago with another partner.  No complication.  Husband has a 34 yo child.  Patient with Hypothyroidism on Synthroid 0.175 microgram daily because of H/O Thyroid Ca 4 yrs ago, status post Thyroidectomy, followed by Dr Chalmers Cater.  Last TSH was high, increased her Synthroid.  Breasts normal.  No nipple discharge.  BMI 30.21.  Not very physically active.   OB History  Gravida Para Term Preterm AB Living  1 0     1 0  SAB TAB Ectopic Multiple Live Births  1            # Outcome Date GA Lbr Len/2nd Weight Sex Delivery Anes PTL Lv  1 SAB             Past medical history,surgical history, problem list, medications, allergies, family history and social history were all reviewed and documented in the EPIC chart.   Directed ROS with pertinent positives and negatives documented in the history of present illness/assessment and plan.  Exam:  There were no vitals filed for this visit. General appearance:  Normal  Labs 06/20/2020: Prolactin 12.6 normal Day 21 Progesterone 17.0 TSH 0.06 (Thyroidectomy on Synthroid)   Assessment/Plan:  34 y.o. G1P0010   1. Primary female infertility Attempting conception x3 years.  Had a complete spontaneous AB in the 1st trimester 12 years ago with a different partner.  Husband has a 53-year-old child.  Menses are regular and normal with an ovulatory progesterone at 17 last cycle.  History of thyroid cancer status post thyroidectomy on Synthroid.  TSH is low at 0.06.  Dr. Michiel Sites will adjust patient's Synthroid dosage.  Prolactin normal at 12.6.  We will complete the work-up with hysterosalpingography under doxycycline  after next period.  Scheduling a sperm analysis for her husband.  Further management per results.  Will probably refer to a fertility specialist.  -HSG under Doxy -Sperm analysis  Princess Bruins MD, 8:12 AM 07/04/2020

## 2020-07-04 NOTE — Telephone Encounter (Signed)
-----   Message from Princess Bruins, MD sent at 07/04/2020  8:29 AM EST ----- Regarding: Schedule HSG under Doxy and Sperm Analysis HSG under Doxy after next period and Sperm Analysis.

## 2020-07-07 ENCOUNTER — Telehealth: Payer: Self-pay

## 2020-07-07 NOTE — Telephone Encounter (Signed)
Patient needs to schedule HSG. Period started 07/07/20.  Thanks

## 2020-07-10 MED ORDER — DOXYCYCLINE HYCLATE 50 MG PO CAPS: 50.0000 mg | ORAL_CAPSULE | Freq: Two times a day (BID) | ORAL | 0 refills | Status: DC

## 2020-07-10 NOTE — Telephone Encounter (Signed)
Order placed at [facility], rx for doxy for patient to take starting day before procedure. Number given for patient to call and schedule HSG with [facility] at [place].

## 2020-07-10 NOTE — Telephone Encounter (Signed)
Patient called LMP:07/07/20

## 2020-07-11 LAB — TSH: TSH: 0.06 mIU/L — ABNORMAL LOW

## 2020-07-11 LAB — THYROID STIMULATING IMMUNOGLOBULIN

## 2020-07-11 LAB — PROGESTERONE: Progesterone: 17 ng/mL

## 2020-07-11 LAB — T4, FREE

## 2020-07-11 LAB — PROLACTIN: Prolactin: 12.6 ng/mL

## 2020-07-11 NOTE — Telephone Encounter (Signed)
See telephone encounter 07/04/20

## 2020-07-12 ENCOUNTER — Other Ambulatory Visit: Payer: Self-pay

## 2020-07-12 DIAGNOSIS — R946 Abnormal results of thyroid function studies: Secondary | ICD-10-CM

## 2020-07-13 ENCOUNTER — Ambulatory Visit
Admission: RE | Admit: 2020-07-13 | Discharge: 2020-07-13 | Disposition: A | Discharge status: Home / Self Care | Payer: No Typology Code available for payment source | Source: Ambulatory Visit | Attending: Obstetrics & Gynecology | Admitting: Obstetrics & Gynecology

## 2020-07-13 DIAGNOSIS — N979 Female infertility, unspecified: Secondary | ICD-10-CM

## 2020-07-13 NOTE — Telephone Encounter (Signed)
Patient was scheduled on 07/13/20 however she had sexually intercourse before procedure and appointment was canceled.

## 2020-07-14 ENCOUNTER — Other Ambulatory Visit: Payer: No Typology Code available for payment source

## 2020-07-14 ENCOUNTER — Other Ambulatory Visit: Payer: Self-pay

## 2020-07-14 DIAGNOSIS — R946 Abnormal results of thyroid function studies: Secondary | ICD-10-CM

## 2020-07-18 LAB — THYROID STIMULATING IMMUNOGLOBULIN

## 2020-07-18 LAB — T3, FREE: T3, Free: 3.6 pg/mL (ref 2.3–4.2)

## 2020-07-18 LAB — T4, FREE: Free T4: 2.2 ng/dL — ABNORMAL HIGH (ref 0.8–1.8)

## 2020-07-26 ENCOUNTER — Encounter: Payer: Self-pay | Admitting: Anesthesiology

## 2020-08-03 ENCOUNTER — Ambulatory Visit: Payer: No Typology Code available for payment source | Admitting: Obstetrics & Gynecology

## 2020-08-03 ENCOUNTER — Encounter: Payer: Self-pay | Admitting: Obstetrics & Gynecology

## 2020-08-03 ENCOUNTER — Other Ambulatory Visit: Payer: Self-pay

## 2020-08-03 ENCOUNTER — Telehealth: Payer: Self-pay

## 2020-08-03 VITALS — BP 126/82

## 2020-08-03 DIAGNOSIS — Z113 Encounter for screening for infections with a predominantly sexual mode of transmission: Secondary | ICD-10-CM

## 2020-08-03 DIAGNOSIS — N979 Female infertility, unspecified: Secondary | ICD-10-CM | POA: Diagnosis not present

## 2020-08-03 DIAGNOSIS — N9412 Deep dyspareunia: Secondary | ICD-10-CM

## 2020-08-03 NOTE — Progress Notes (Signed)
    [address] Gina [place] 21-Nov-1985 [id number]        35 y.o.  G1P0010   RP: Deep dyspareunia  HPI: Primary infertility x 3 years under investigation.  Will do a HSG after her current menstrual period.  No pelvic pain except with IC.  Deep dyspareunia in the pelvis.  Tried changes in position without improvement.  Adjusted her treatment for Hypothyroidism and feels that she is ovulating.     OB History  Gravida Para Term Preterm AB Living  1 0     1 0  SAB IAB Ectopic Multiple Live Births  1            # Outcome Date GA Lbr Len/2nd Weight Sex Delivery Anes PTL Lv  1 SAB             Past medical history,surgical history, problem list, medications, allergies, family history and social history were all reviewed and documented in the EPIC chart.   Directed ROS with pertinent positives and negatives documented in the history of present illness/assessment and plan.  Exam:  Vitals:   08/03/20 1416  BP: 126/82   General appearance:  Normal  Abdomen: Normal  Gynecologic exam: Vulva normal.  Speculum:  Cervix/vagina normal.  Light menstrual flow.  Gono-Chlam done.  Bimanual exam:  Uterus AV, normal volume, NT, mobile.  No adnexal mass, NT bilaterally.   Assessment/Plan:  35 y.o. G1P0010   1. Deep dyspareunia in female Deep dyspareunia.  Normal gyn exam today, except for mild tenderness.  R/O Gono-Chlam.  F/U Pelvic US for further investigation. - US Transvaginal Non-OB; Future -Gono-Chlam  2. Primary female infertility Sperm analysis Normal except morphology 99% abnormal heads.  Will proceed with HSG after her current menses.  Genia Del MD, 2:20 PM 08/03/2020

## 2020-08-03 NOTE — Telephone Encounter (Signed)
Left voice mail message. Has a question for nurse. I called patient and spoke with her. She said at last visit she mentioned to Dr. Mackey Birchwood regarding abd pain with intercouse. She said this has been going on for 2-3 mos and now in the last two-three weeks has worsened to terrible pain with intercourse. She is trying to conceive as well.  Recommended office visit. Staff message sent to [facility] asking her to schedule her an appointment.

## 2020-08-03 NOTE — Telephone Encounter (Signed)
Appt scheduled 08/03/20

## 2020-08-04 LAB — C. TRACHOMATIS/N. GONORRHOEAE RNA
C. trachomatis RNA, TMA: NOT DETECTED
N. gonorrhoeae RNA, TMA: NOT DETECTED

## 2020-08-10 ENCOUNTER — Other Ambulatory Visit: Payer: Self-pay

## 2020-08-10 ENCOUNTER — Ambulatory Visit
Admission: RE | Admit: 2020-08-10 | Discharge: 2020-08-10 | Disposition: A | Discharge status: Home / Self Care | Payer: No Typology Code available for payment source | Source: Ambulatory Visit | Attending: Obstetrics & Gynecology | Admitting: Obstetrics & Gynecology

## 2020-08-10 DIAGNOSIS — N979 Female infertility, unspecified: Secondary | ICD-10-CM

## 2020-08-22 ENCOUNTER — Other Ambulatory Visit: Payer: No Typology Code available for payment source

## 2020-08-22 ENCOUNTER — Ambulatory Visit: Payer: No Typology Code available for payment source | Admitting: Obstetrics & Gynecology

## 2020-08-31 ENCOUNTER — Other Ambulatory Visit: Payer: Self-pay

## 2020-08-31 ENCOUNTER — Encounter: Payer: Self-pay | Admitting: Obstetrics & Gynecology

## 2020-08-31 ENCOUNTER — Ambulatory Visit: Payer: No Typology Code available for payment source | Admitting: Obstetrics & Gynecology

## 2020-08-31 ENCOUNTER — Ambulatory Visit (INDEPENDENT_AMBULATORY_CARE_PROVIDER_SITE_OTHER): Payer: No Typology Code available for payment source

## 2020-08-31 VITALS — BP 128/80

## 2020-08-31 DIAGNOSIS — N9412 Deep dyspareunia: Secondary | ICD-10-CM

## 2020-08-31 DIAGNOSIS — N469 Male infertility, unspecified: Secondary | ICD-10-CM

## 2020-08-31 NOTE — Progress Notes (Signed)
    Paia February 29, 1986 [id number]        35 y.o.  G1P0010   RP: Deep dyspareunia for Pelvic US  HPI:  Primary infertility x 3 years under investigation. HSG showed bilateral patent tubes and a normal uterine cavity.  No pelvic pain except with IC.  Deep dyspareunia in the pelvis.  Tried changes in position without improvement.  Adjusted her treatment for Hypothyroidism and feels that she is ovulating.    Sperm analysis showed 99% abnormality in the morphology of the head of the spermatozoids.  OB History  Gravida Para Term Preterm AB Living  1 0     1 0  SAB IAB Ectopic Multiple Live Births  1            # Outcome Date GA Lbr Len/2nd Weight Sex Delivery Anes PTL Lv  1 SAB             Past medical history,surgical history, problem list, medications, allergies, family history and social history were all reviewed and documented in the EPIC chart.   Directed ROS with pertinent positives and negatives documented in the history of present illness/assessment and plan.  Exam:  Vitals:   08/31/20 0828  BP: 128/80   General appearance:  Normal  Pelvic US today: T/V images.  Anteverted uterus normal in size and shape with no myometrial mass.  The uterus is measured at 7.4 x 4.61 x 3.63 cm.  Symmetrical endometrial lining measured at 7.85 mm with no mass seen  (day #29).  Both ovaries are normal in size with normal follicular pattern.  A collapsed avascular corpus luteum cyst is present on the right ovary measured at 1.2 cm.  No adnexal mass seen.  No free fluid in the posterior cul-de-sac.   Assessment/Plan:  35 y.o. G1P0010   1. Deep dyspareunia in female Pelvic ultrasound findings thoroughly reviewed with patient.  Patient reassured that her uterus and bilateral ovaries are normal with no adnexal mass.  Counseling done on position, direction and depth of intercourse.  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  Patient is ovulatory.  Probable female factor infertility with sperm analysis showing 99% abnormality in the morphology of the head of the spermatozoids.  Will consult a fertility specialist as needed.  Princess Bruins MD, 8:48 AM 08/31/2020

## 2020-09-10 ENCOUNTER — Encounter: Payer: Self-pay | Admitting: Obstetrics & Gynecology

## 2020-09-25 ENCOUNTER — Other Ambulatory Visit (HOSPITAL_COMMUNITY): Payer: Self-pay | Admitting: Physician Assistant

## 2020-10-02 NOTE — Progress Notes (Signed)
Telehealth Encounter (Text link to [phone] ) PCP Maude Leriche, PA [facility]) I connected with [facility] [name] (MRN [id number]) on 10/03/2020 by MyChart video-enabled, HIPAA-compliant telemedicine application, verified that I was speaking with the correct person using two identifiers, and that the patient was in a private environment conducive to confidentiality.  The patient agreed to proceed.  Persons participating in visit were patient and provider (registered dietitian) Kennith Center, PhD, RD, LDN, CEDRD.  Provider was located at [place] during this telehealth encounter; patient was at work.  Appt start time: 0900 end time: 1000 (1 hour)  Reason for telehealth visit: Referred by Maude Leriche, PA for Medical Nutrition Therapy related to Obesity (BMI 30-34.9).  Relevant history/background: Ms. Gina Sullivan's weight was usually 145-150 lb before she had thyroid cancer 3 or 4 yrs ago.  She has struggled with weight since her cancer treatment even though thyroid labs are within normal range.  She has recently been doing a "21-day reset," which is part of the [employer].  Assessment:  Weight: Not measured today (patient was at work); 176 lb on 06/20/20; ht 64"; BMI 30.21.   Usual eating pattern: 2 meals and 1 snack per day. Frequent foods and beverages: water, coffee w/ cinnamon&nutmeg, protein shake, juice ~3 X wk; ground [place], chx, veg's, brown rice, eggs, swt potatoes, almond butter.   Avoided foods: dairy foods (lactose intolerant), fried foods.   Usual physical activity: none currently.  Feels like her body does not recover from exercise - pain and fatigue.  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  Sleep: Estimates average of 7-8 hours of sleep/night.  24-hr recall suggests intake of ~975 kcal:  (Up at 7:10 AM) B (7:45 AM)-  1 c coffee  Snk (10 AM)-  1/2 c  blueberries, 3 strawberries, 1 peach, water   120 L (12 PM)-  2 hb eggs, side salad, fat-free drsng, water   225 Snk (3 PM)-  12 crackers (90 kcal)        90 D (4:30 PM)-  1 c brown rice&blk beans, 1/2 steamed zucchini, water 270 Snk ( PM)-  Be[place] Body protein shake (170 kcal), 1 tbsp almond butter 270 Typical day? Yes.   Normal for the 21-day reset regimen.  If not following this routine, bkfast is usually 2 eggs, +/- 1 toast; lunch is usually large salad w/ chx or brown rice with beans, and dinner is protein shake.  Intervention: Completed diet and exercise history, and established behavioral goals.   For recommendations and goals, see Patient Instructions.    Follow-up: 10 weeks.    Corleone Biegler,JEANNIE

## 2020-10-03 ENCOUNTER — Ambulatory Visit (INDEPENDENT_AMBULATORY_CARE_PROVIDER_SITE_OTHER): Payer: No Typology Code available for payment source | Admitting: Family Medicine

## 2020-10-03 DIAGNOSIS — E669 Obesity, unspecified: Secondary | ICD-10-CM | POA: Diagnosis not present

## 2020-10-03 NOTE — Patient Instructions (Addendum)
Principles for weight management.  1. Avoid getting over-hungry.   - Eat at regular intervals through the day, preferably about every 4 hours.   - Include a protein food with each meal - at least 3 oz of meat, fish, or poultry, or 2 eggs, or 1 full cup of beans.    - Include a LOT of vegetables at lunch and dinner, and low-glycemic fruits at breakfast (fruits that don't spike blood sugar, such as berries, peaches, pears, apples).  [high-glycemic fruits include bananas, watermelon, grapes, and oranges: Eat small portions of these fruits.]  - A bedtime snack is appropriate if dinner is especially early or minimal in calories.  This could be some fruit or fruit + cheese or a few crackers with almond butter.  2. Eat foods that are mostly low-caloric density.  This means foods that are low in fat, high in fiber, and NOT highly processed.    - Aim for vegetables to be 1/2 the volume of lunch and dinner.  (For example, before dinner protein shake, eat some raw vegetables such as carrots, celery, pepper strips, cucumber, sugar snap peas.)   - It makes sense to include some carbohydrate at breakfast (in addition to protein) b/c you have fasted overnight, and blood sugar levels are low at this time.    NOTE: It will be easier for you to eat a lot of vegetables if you like a wide variety of vegetables.  This means you may want to work on expanding the types of vegetables you eat.    Examples of what these principles look like in terms of meals: Breakfast: 2 eggs, fruit, 1 toast Lunch: Large salad with chicken and a piece of fruit OR up to 1 cup of brown rice (or a sweet potato), a full (8-ounce) cup of beans, and lots of vegetables.   Afternoon snack: Fruit OR fruit + cheese OR 4-6 crackers with almond butter.  Dinner (while you are driving): Protein shake following bag of vegetables.   Evening snack: same as afternoon.  The meal plan you have been following will help you lose weight, but it is inadequate  for many nutrients, and its very restricted energy level will mean that much of the weight you lose will be muscle.  For continued weight management, you don't want to lose muscle, which is your body's most active tissue in terms of burning calories.  I recommend that you consume a bit more food, which will allow a slower weight loss that will not result in so much loss of muscle, will be easier for you to sustain, and will be more likely to be adequate in nutrition to keep you healthy.  Clearly, exercise would be of great benefit to your health as well as to weight management.  While you continue to work two jobs, time constraints may disallow dedicated exercise time, but attention to mini-exercise breaks can be helpful.  Examples: Take the stairs vs. elevator whenever possible; Park the car in the spot furthest away at work or in running errands; Do squats while brushing your teeth; Stand and walk in place during commercials when watching television; Lift your bottom off the car seat while at a red light, contracting and holding your quadriceps; Set aside just 5 or 10 minutes to pull weeds in the yard.    Follow-up video appt on Thursday, May 19 at 9 AM.

## 2020-10-27 ENCOUNTER — Telehealth: Payer: Self-pay | Admitting: *Deleted

## 2020-10-27 ENCOUNTER — Other Ambulatory Visit (HOSPITAL_COMMUNITY): Payer: Self-pay | Admitting: Physician Assistant

## 2020-10-27 NOTE — Telephone Encounter (Signed)
Patient called and left message in triage asking if could fax referral to [facility] referral faxed they will call the patient to schedule.

## 2020-10-30 NOTE — Telephone Encounter (Signed)
Will route to [place] to relay to patient this has been done.

## 2020-11-27 ENCOUNTER — Other Ambulatory Visit (HOSPITAL_COMMUNITY): Payer: Self-pay

## 2020-11-27 MED ORDER — ESCITALOPRAM OXALATE 10 MG PO TABS
10.0000 mg | ORAL_TABLET | Freq: Every day | ORAL | 1 refills | Status: DC
  Filled 2020-11-27: qty 30, 30d supply, fill #0

## 2020-11-27 MED ORDER — ALPRAZOLAM 0.25 MG PO TABS
0.2500 mg | ORAL_TABLET | ORAL | 0 refills | Status: DC
  Filled 2020-11-27: qty 10, 10d supply, fill #0

## 2020-11-28 ENCOUNTER — Other Ambulatory Visit (HOSPITAL_COMMUNITY): Payer: Self-pay

## 2020-11-28 ENCOUNTER — Other Ambulatory Visit: Payer: Self-pay

## 2020-11-28 MED ORDER — LEVOTHYROXINE SODIUM 175 MCG PO TABS
175.0000 ug | ORAL_TABLET | Freq: Every day | ORAL | 6 refills | Status: DC
  Filled 2020-11-28: qty 26, 30d supply, fill #0
  Filled 2020-12-28: qty 26, 30d supply, fill #1
  Filled 2021-01-25: qty 26, 30d supply, fill #2
  Filled 2021-06-26: qty 26, 30d supply, fill #3
  Filled 2021-07-25: qty 26, 30d supply, fill #4
  Filled 2021-08-23: qty 26, 30d supply, fill #5

## 2020-11-28 MED FILL — Levothyroxine Sodium Tab 175 MCG: ORAL | 30 days supply | Qty: 30 | Fill #0 | Status: CN

## 2020-12-28 ENCOUNTER — Other Ambulatory Visit (HOSPITAL_COMMUNITY): Payer: Self-pay

## 2021-01-10 ENCOUNTER — Other Ambulatory Visit (HOSPITAL_COMMUNITY): Payer: Self-pay

## 2021-01-10 MED ORDER — FLUCONAZOLE 150 MG PO TABS
150.0000 mg | ORAL_TABLET | ORAL | 1 refills | Status: DC
  Filled 2021-01-10: qty 1, 1d supply, fill #0

## 2021-01-25 ENCOUNTER — Other Ambulatory Visit (HOSPITAL_COMMUNITY): Payer: Self-pay

## 2021-02-23 ENCOUNTER — Other Ambulatory Visit: Payer: Self-pay | Admitting: Endocrinology

## 2021-02-23 DIAGNOSIS — N6489 Other specified disorders of breast: Secondary | ICD-10-CM

## 2021-02-27 ENCOUNTER — Other Ambulatory Visit: Payer: Self-pay

## 2021-02-27 MED FILL — Levothyroxine Sodium Tab 175 MCG: ORAL | 30 days supply | Qty: 30 | Fill #0 | Status: CN

## 2021-02-28 ENCOUNTER — Other Ambulatory Visit (HOSPITAL_COMMUNITY): Payer: Self-pay

## 2021-02-28 ENCOUNTER — Other Ambulatory Visit: Payer: Self-pay

## 2021-02-28 MED ORDER — ALPRAZOLAM 0.25 MG PO TABS
0.2500 mg | ORAL_TABLET | Freq: Two times a day (BID) | ORAL | 0 refills | Status: DC | PRN
  Filled 2021-02-28: qty 10, 5d supply, fill #0

## 2021-02-28 MED FILL — Levothyroxine Sodium Tab 175 MCG: ORAL | 30 days supply | Qty: 30 | Fill #0 | Status: AC

## 2021-03-07 ENCOUNTER — Other Ambulatory Visit: Payer: Self-pay

## 2021-03-16 ENCOUNTER — Other Ambulatory Visit (HOSPITAL_COMMUNITY): Payer: Self-pay

## 2021-03-16 MED ORDER — LEVOTHYROXINE SODIUM 175 MCG PO TABS
175.0000 ug | ORAL_TABLET | Freq: Every morning | ORAL | 4 refills | Status: DC
  Filled 2021-03-16: qty 30, 30d supply, fill #0
  Filled 2021-04-03: qty 24, 24d supply, fill #0
  Filled 2021-04-27: qty 24, 24d supply, fill #1
  Filled 2021-05-28: qty 24, 28d supply, fill #2

## 2021-03-30 ENCOUNTER — Other Ambulatory Visit: Payer: Self-pay

## 2021-03-30 ENCOUNTER — Ambulatory Visit
Admission: RE | Admit: 2021-03-30 | Discharge: 2021-03-30 | Disposition: A | Discharge status: Home / Self Care | Payer: 59 | Source: Ambulatory Visit | Attending: Endocrinology | Admitting: Endocrinology

## 2021-03-30 DIAGNOSIS — N6489 Other specified disorders of breast: Secondary | ICD-10-CM

## 2021-03-30 IMAGING — MG DIGITAL DIAGNOSTIC BILAT W/ TOMO W/ CAD
8 series · 8 of 24 positions shown · non-contrast
Comparison: Previous exam(s).

CLINICAL DATA: 35-year-old female presenting for final 2 year
follow-up of a probably benign right breast asymmetry without
ultrasound correlate.

EXAM:
DIGITAL DIAGNOSTIC BILATERAL MAMMOGRAM WITH TOMOSYNTHESIS AND CAD
TECHNIQUE: Bilateral digital diagnostic mammography and breast tomosynthesis
was performed. The images were evaluated with computer-aided
detection.

[R CC synth-2D]
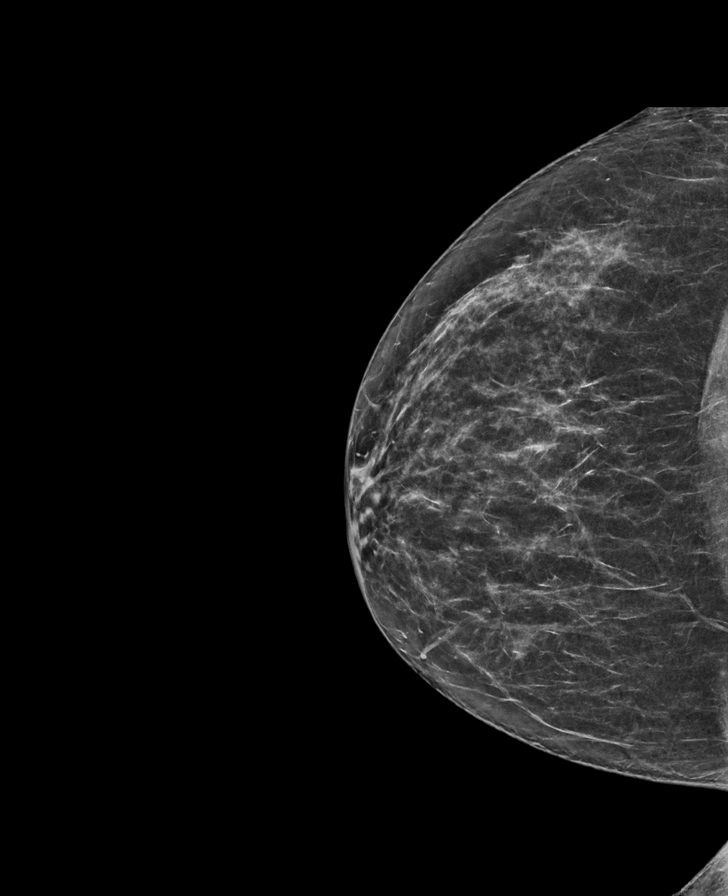

[R MLO synth-2D]
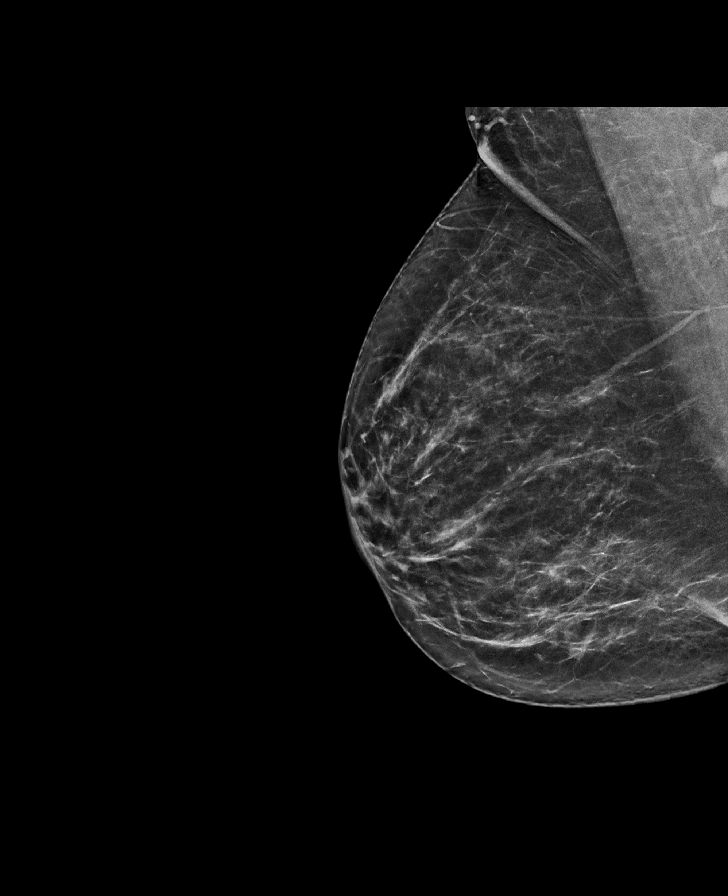

[L MLO synth-2D]
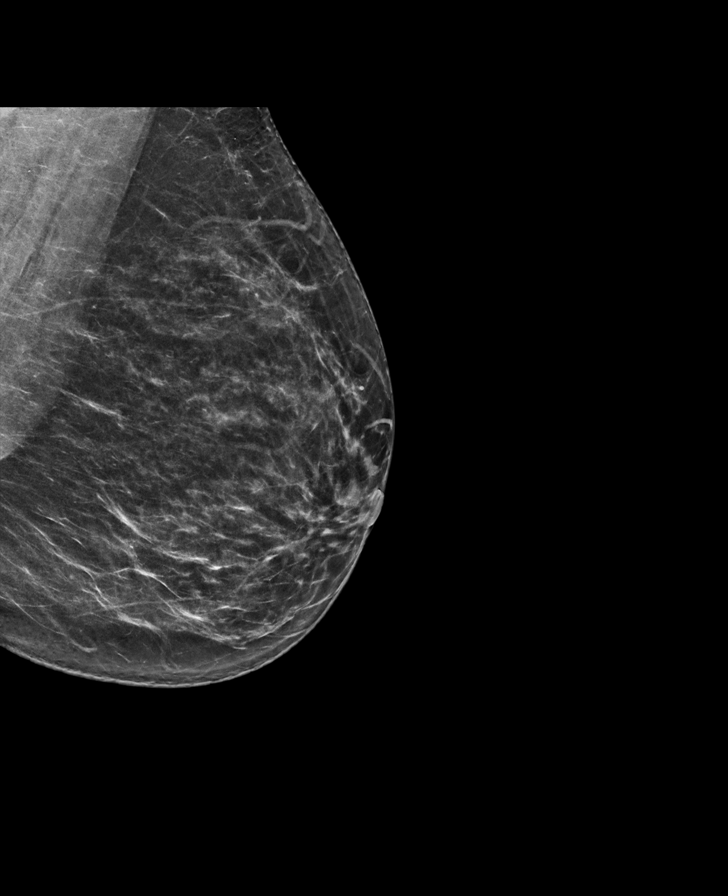

[L CC synth-2D]
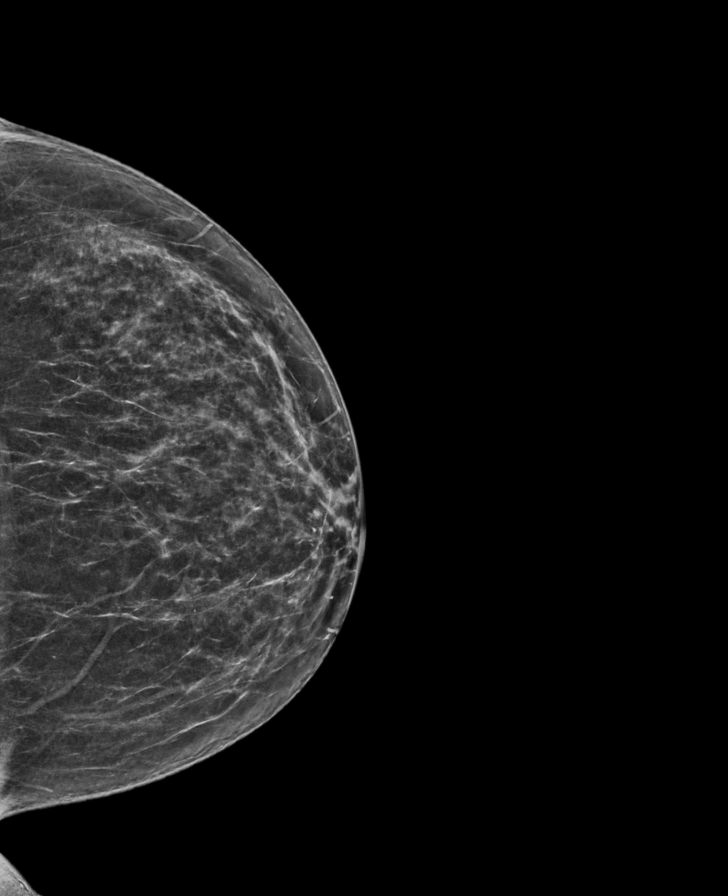

[R MLO tomo · tomo slice 39/76.0]
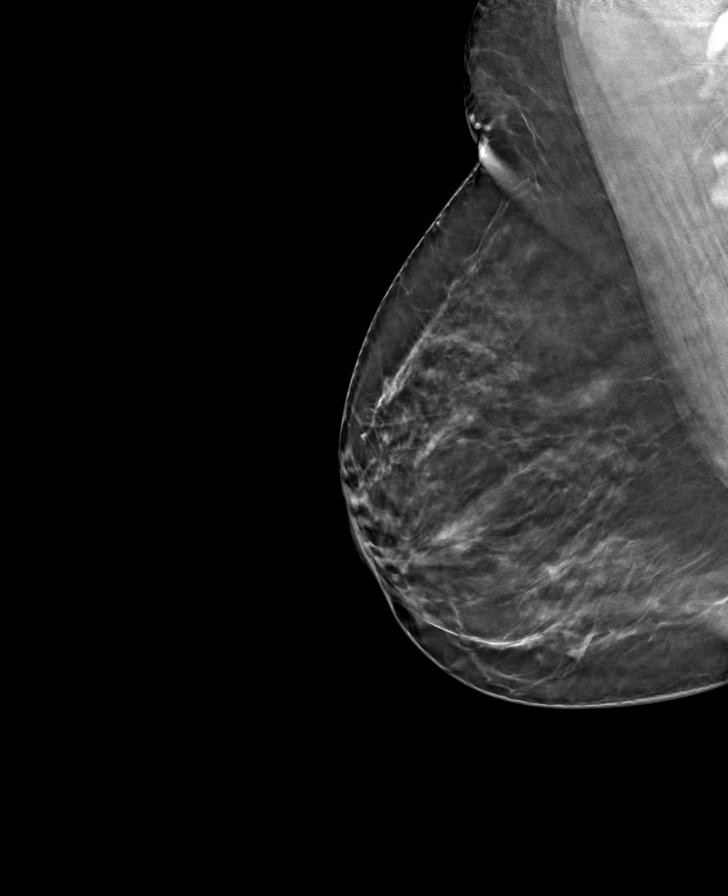

[R CC tomo · tomo slice 33/65.0]
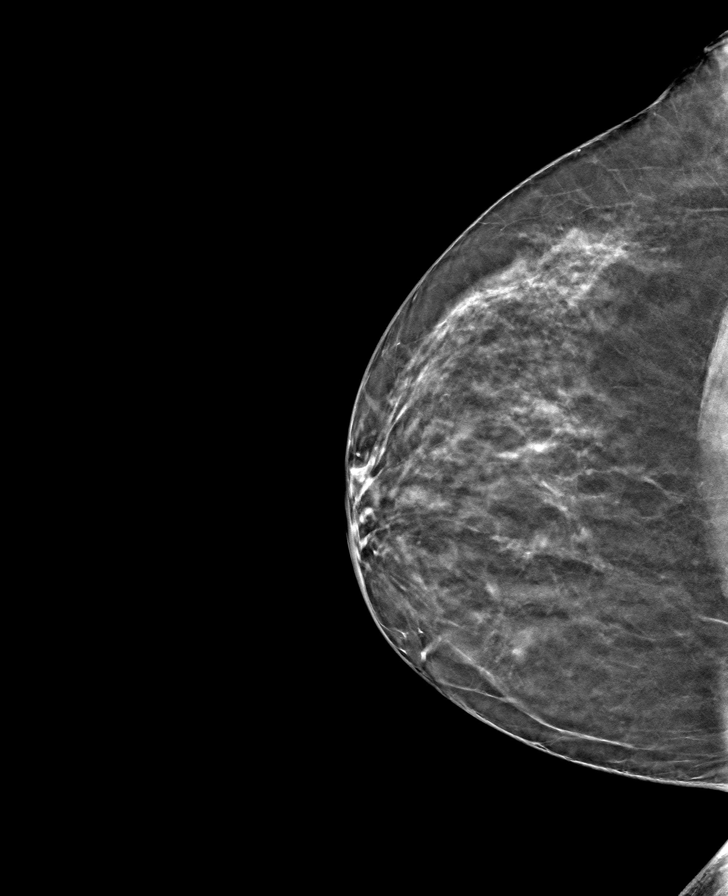

[L MLO tomo · tomo slice 37/73.0]
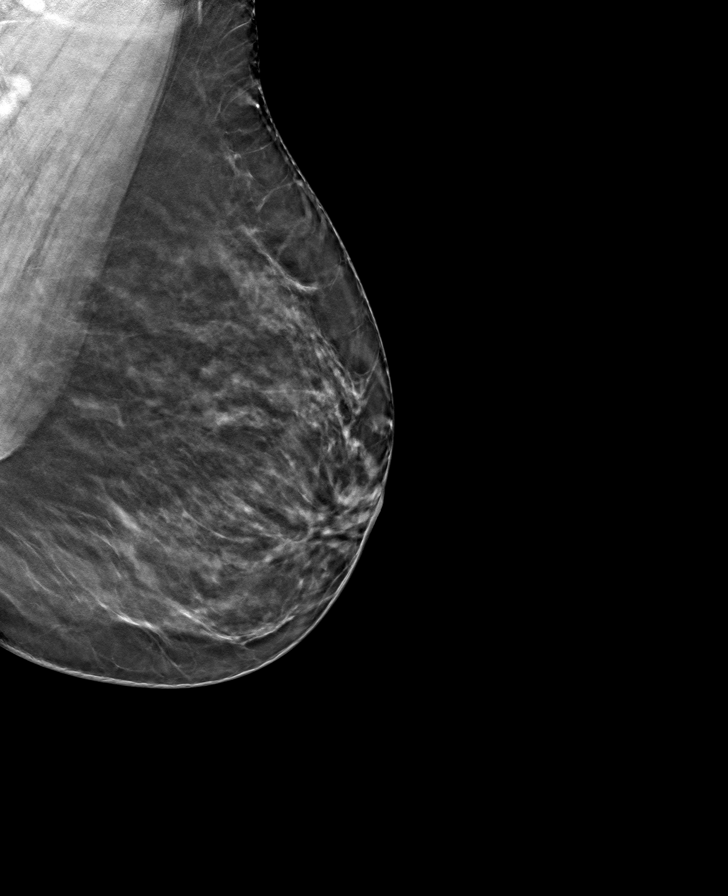

[L CC tomo · tomo slice 33/65.0]
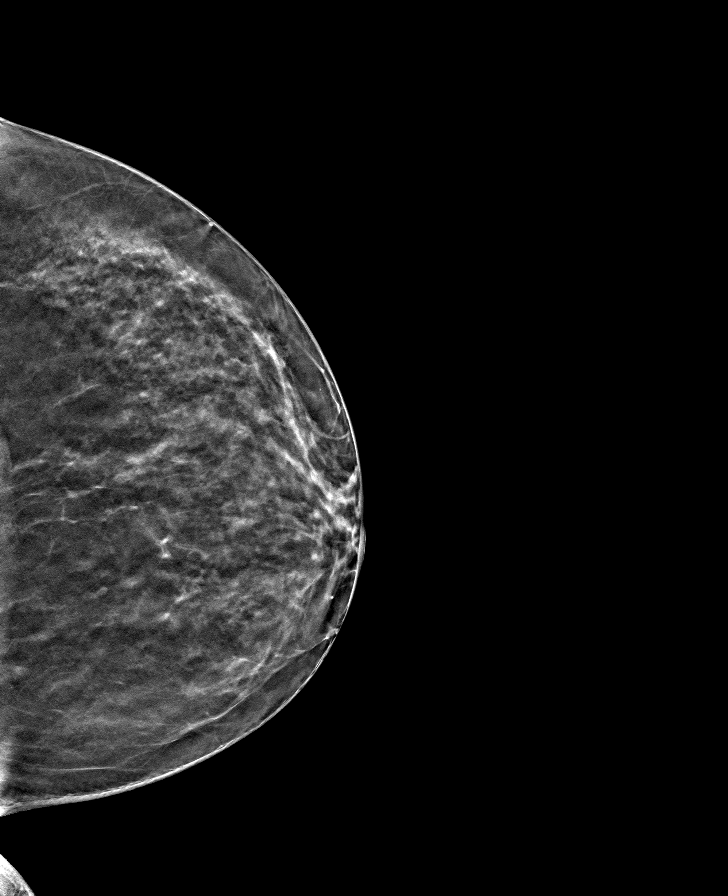

[8 of 24 positions shown; findings below may reference images not displayed]

ACR Breast Density Category b: There are scattered areas of
fibroglandular density.
FINDINGS: Asymmetry in the medial right breast at mid depth is
mammographically stable. Otherwise, no new or suspicious findings in
either breast. The parenchymal pattern is stable.
IMPRESSION: 1. No mammographic evidence of malignancy in either breast.
2. Benign right breast asymmetry demonstrating 2 year stability. No
further imaging follow-up required.

RECOMMENDATION:
Screening mammogram at age 42 unless there are persistent or
intervening clinical concerns. (Code:OM-8-PQP)

I have discussed the findings and recommendations with the patient.
If applicable, a reminder letter will be sent to the patient
regarding the next appointment.

BI-RADS CATEGORY  2: Benign.

## 2021-04-03 ENCOUNTER — Other Ambulatory Visit (HOSPITAL_COMMUNITY): Payer: Self-pay

## 2021-04-03 MED ORDER — NORGESTIMATE-ETH ESTRADIOL 0.25-35 MG-MCG PO TABS
1.0000 | ORAL_TABLET | Freq: Every day | ORAL | 1 refills | Status: DC
  Filled 2021-04-03: qty 28, 21d supply, fill #0
  Filled 2021-05-28: qty 28, 21d supply, fill #1

## 2021-04-27 ENCOUNTER — Other Ambulatory Visit (HOSPITAL_COMMUNITY): Payer: Self-pay

## 2021-04-30 ENCOUNTER — Other Ambulatory Visit (HOSPITAL_COMMUNITY): Payer: Self-pay

## 2021-05-28 ENCOUNTER — Other Ambulatory Visit (HOSPITAL_COMMUNITY): Payer: Self-pay

## 2021-06-26 ENCOUNTER — Other Ambulatory Visit (HOSPITAL_COMMUNITY): Payer: Self-pay

## 2021-06-27 ENCOUNTER — Other Ambulatory Visit (HOSPITAL_COMMUNITY): Payer: Self-pay

## 2021-06-27 MED ORDER — ALPRAZOLAM 0.25 MG PO TABS
0.2500 mg | ORAL_TABLET | Freq: Two times a day (BID) | ORAL | 0 refills | Status: DC | PRN
  Filled 2021-06-27: qty 10, 5d supply, fill #0

## 2021-07-05 ENCOUNTER — Ambulatory Visit: Payer: Self-pay | Admitting: Obstetrics & Gynecology

## 2021-07-10 ENCOUNTER — Other Ambulatory Visit (HOSPITAL_COMMUNITY): Payer: Self-pay

## 2021-07-10 MED ORDER — PROGESTERONE 50 MG/ML IM OIL
50.0000 mg | TOPICAL_OIL | Freq: Every day | INTRAMUSCULAR | 3 refills | Status: DC
  Filled 2021-07-10: qty 20, 20d supply, fill #0

## 2021-07-10 MED ORDER — "BD LUER-LOK SYRINGE 18G X 1-1/2"" 3 ML MISC"
3 refills | Status: DC
  Filled 2021-07-10 (×2): qty 30, 30d supply, fill #0

## 2021-07-10 MED ORDER — METHYLPREDNISOLONE 8 MG PO TABS
8.0000 mg | ORAL_TABLET | Freq: Two times a day (BID) | ORAL | 0 refills | Status: DC
  Filled 2021-07-10: qty 8, 4d supply, fill #0

## 2021-07-10 MED ORDER — ESTRADIOL 2 MG PO TABS
2.0000 mg | ORAL_TABLET | Freq: Two times a day (BID) | ORAL | 3 refills | Status: DC
  Filled 2021-07-10: qty 60, 30d supply, fill #0
  Filled 2021-08-14: qty 60, 30d supply, fill #1

## 2021-07-10 MED ORDER — "BD HYPODERMIC NEEDLE 22G X 1-1/2"" MISC"
3 refills | Status: DC
  Filled 2021-07-10 (×2): qty 30, 30d supply, fill #0

## 2021-07-10 MED ORDER — ESTRADIOL 0.1 MG/24HR TD PTTW
1.0000 | MEDICATED_PATCH | TRANSDERMAL | 3 refills | Status: DC
  Filled 2021-07-10: qty 8, 24d supply, fill #0
  Filled 2021-08-04: qty 8, 24d supply, fill #1

## 2021-07-11 ENCOUNTER — Other Ambulatory Visit (HOSPITAL_COMMUNITY): Payer: Self-pay

## 2021-07-12 ENCOUNTER — Encounter: Payer: Self-pay | Admitting: Obstetrics & Gynecology

## 2021-07-12 ENCOUNTER — Other Ambulatory Visit (HOSPITAL_COMMUNITY)
Admission: RE | Admit: 2021-07-12 | Discharge: 2021-07-12 | Disposition: A | Discharge status: Home / Self Care | Payer: 59 | Source: Ambulatory Visit | Attending: Obstetrics & Gynecology | Admitting: Obstetrics & Gynecology

## 2021-07-12 ENCOUNTER — Ambulatory Visit (INDEPENDENT_AMBULATORY_CARE_PROVIDER_SITE_OTHER): Payer: 59 | Admitting: Obstetrics & Gynecology

## 2021-07-12 ENCOUNTER — Other Ambulatory Visit: Payer: Self-pay

## 2021-07-12 VITALS — BP 110/70 | HR 98 | Resp 16 | Ht 64.25 in | Wt 180.0 lb

## 2021-07-12 DIAGNOSIS — C73 Malignant neoplasm of thyroid gland: Secondary | ICD-10-CM | POA: Diagnosis not present

## 2021-07-12 DIAGNOSIS — N979 Female infertility, unspecified: Secondary | ICD-10-CM | POA: Diagnosis not present

## 2021-07-12 DIAGNOSIS — Z01419 Encounter for gynecological examination (general) (routine) without abnormal findings: Secondary | ICD-10-CM | POA: Insufficient documentation

## 2021-07-12 LAB — PREGNANCY, URINE: Preg Test, Ur: NEGATIVE

## 2021-07-12 NOTE — Progress Notes (Signed)
Gina Sullivan [birth year] [id number]   History:    35 y.o.  G3P0A1 Married   RP:  Established patient presenting for annual gyn exam    HPI: Menses regular every month with normal flow.  No BTB.  No pelvic pain.  In the process of IVF, 2 embryos ready for transfer next cycle.  H/O first trimester complete spontaneous abortion >12 yrs ago with another partner. Husband has a 69  yo child. 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  Breasts normal.  Had a Lt screening Mammo/Rt Dx mammo/US Benign, stable in 03/2021.  BMI 30.66.  Not very physically active.  Health labs with Fam MD.  Past medical history,surgical history, family history and social history were all reviewed and documented in the EPIC chart.  Gynecologic History Patient's last menstrual period was 06/06/2021 (exact date).  Obstetric History OB History  Gravida Para Term Preterm AB Living  1 0     1 0  SAB IAB Ectopic Multiple Live Births  1            # Outcome Date GA Lbr Len/2nd Weight Sex Delivery Anes PTL Lv  1 SAB              ROS: A ROS was performed and pertinent positives and negatives are included in the history.  GENERAL: No fevers or chills. HEENT: No change in vision, no earache, sore throat or sinus congestion. NECK: No pain or stiffness. CARDIOVASCULAR: No chest pain or pressure. No palpitations. PULMONARY: No shortness of breath, cough or wheeze. GASTROINTESTINAL: No abdominal pain, nausea, vomiting or diarrhea, melena or bright red blood per rectum. GENITOURINARY: No urinary frequency, urgency, hesitancy or dysuria. MUSCULOSKELETAL: No joint or muscle pain, no back pain, no recent trauma. DERMATOLOGIC: No rash, no itching, no lesions. ENDOCRINE: No polyuria, polydipsia, no heat or cold intolerance. No recent change in weight. HEMATOLOGICAL: No anemia or easy bruising or bleeding. NEUROLOGIC: No  headache, seizures, numbness, tingling or weakness. PSYCHIATRIC: No depression, no loss of interest in normal activity or change in sleep pattern.     Exam:   BP 110/70    Pulse 98    Resp 16    Ht 5' 4.25" (1.632 m)    Wt 180 lb (81.6 kg)    LMP 06/06/2021 (Exact Date)    BMI 30.66 kg/m   Body mass index is 30.66 kg/m.  General appearance : Well developed well nourished female. No acute distress HEENT: Eyes: no retinal hemorrhage or exudates,  Neck supple, trachea midline, no carotid bruits, no thyroidmegaly Lungs: Clear to auscultation, no rhonchi or wheezes, or rib retractions  Heart: Regular rate and rhythm, no murmurs or gallops Breast:Examined in sitting and supine position were symmetrical in appearance, no palpable masses or tenderness,  no skin retraction, no nipple inversion, no nipple discharge, no skin discoloration, no axillary or supraclavicular lymphadenopathy Abdomen: no palpable masses or tenderness, no rebound or guarding Extremities: no edema or skin discoloration or tenderness  Pelvic: Vulva: Normal             Vagina: No gross lesions or discharge  Cervix: No gross lesions or discharge.  Pap reflex done.  Uterus  AV, normal size, shape and consistency, non-tender and mobile  Adnexa  Without masses or tenderness  Anus: Normal  UPT Neg   Assessment/Plan:  35 y.o. female for annual exam  1. Encounter for routine gynecological examination with Papanicolaou smear of cervix Menses regular every month with normal flow.  No BTB.  No pelvic pain.  Pap reflex done today.  In the process of IVF, 2 embryos ready for transfer next cycle.  H/O first trimester complete spontaneous abortion >12 yrs ago with another partner. Husband has a 25  yo child. 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  Breasts normal.  Had a Lt screening Mammo/Rt Dx mammo/US Benign, stable in  03/2021.  BMI 30.66.  Not very physically active.  Health labs with Fam MD. - Cytology - PAP( [facility])  2. Primary female infertility In the process of IVF, 2 embryos ready for transfer next cycle.  H/O first trimester complete spontaneous abortion >12 yrs ago with another partner. Husband has a 41  yo child. 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  UPT Neg today. - Pregnancy, urine  3. Thyroid cancer ([place]) S/P Thyroidectomy on Synthroid.  Other orders - omeprazole (PRILOSEC) 10 MG capsule; Take 10 mg by mouth daily. - Prenatal Vit-Fe Fumarate-FA (PRENATAL VITAMIN PO); Take by mouth.  Princess Bruins MD, 8:22 AM 07/12/2021

## 2021-07-13 LAB — CYTOLOGY - PAP: Diagnosis: NEGATIVE

## 2021-07-26 ENCOUNTER — Other Ambulatory Visit (HOSPITAL_COMMUNITY): Payer: Self-pay

## 2021-07-27 ENCOUNTER — Other Ambulatory Visit (HOSPITAL_COMMUNITY): Payer: Self-pay

## 2021-08-06 ENCOUNTER — Other Ambulatory Visit (HOSPITAL_COMMUNITY): Payer: Self-pay

## 2021-08-14 ENCOUNTER — Other Ambulatory Visit (HOSPITAL_COMMUNITY): Payer: Self-pay

## 2021-08-15 ENCOUNTER — Other Ambulatory Visit (HOSPITAL_COMMUNITY): Payer: Self-pay

## 2021-08-15 MED ORDER — PROGESTERONE 50 MG/ML IM OIL
50.0000 mg | TOPICAL_OIL | Freq: Every day | INTRAMUSCULAR | 3 refills | Status: DC
  Filled 2021-08-15: qty 20, 20d supply, fill #0

## 2021-08-17 ENCOUNTER — Other Ambulatory Visit (HOSPITAL_COMMUNITY): Payer: Self-pay

## 2021-08-17 MED ORDER — "BD LUER-LOK SYRINGE 18G X 1-1/2"" 3 ML MISC"
3 refills | Status: DC
  Filled 2021-08-17 (×2): qty 30, 30d supply, fill #0

## 2021-08-17 MED ORDER — PROGESTERONE 50 MG/ML IM OIL
50.0000 mg | TOPICAL_OIL | Freq: Every day | INTRAMUSCULAR | 3 refills | Status: DC
  Filled 2021-08-17: qty 20, 20d supply, fill #0

## 2021-08-17 MED ORDER — "NEEDLE (DISP) 21G X 1-1/2"" MISC"
3 refills | Status: DC
  Filled 2021-08-17: qty 30, 30d supply, fill #0

## 2021-08-17 MED ORDER — METHYLPREDNISOLONE 8 MG PO TABS
8.0000 mg | ORAL_TABLET | Freq: Two times a day (BID) | ORAL | 0 refills | Status: DC
  Filled 2021-08-17: qty 8, 4d supply, fill #0

## 2021-08-20 ENCOUNTER — Other Ambulatory Visit (HOSPITAL_COMMUNITY): Payer: Self-pay

## 2021-08-23 ENCOUNTER — Other Ambulatory Visit (HOSPITAL_COMMUNITY): Payer: Self-pay

## 2021-10-10 ENCOUNTER — Other Ambulatory Visit (HOSPITAL_COMMUNITY): Payer: Self-pay

## 2021-10-10 MED ORDER — ALPRAZOLAM 0.25 MG PO TABS
0.2500 mg | ORAL_TABLET | Freq: Two times a day (BID) | ORAL | 1 refills | Status: DC | PRN
Start: 2021-10-10 — End: 2022-06-21
  Filled 2021-10-10: qty 10, 3d supply, fill #0

## 2021-10-11 ENCOUNTER — Other Ambulatory Visit (HOSPITAL_COMMUNITY): Payer: Self-pay

## 2021-12-12 ENCOUNTER — Other Ambulatory Visit (HOSPITAL_COMMUNITY): Payer: Self-pay

## 2021-12-12 LAB — OB RESULTS CONSOLE RPR: RPR: NONREACTIVE

## 2021-12-12 LAB — HEPATITIS C ANTIBODY: HCV Ab: NEGATIVE

## 2021-12-12 LAB — OB RESULTS CONSOLE GC/CHLAMYDIA
Chlamydia: NEGATIVE
Neisseria Gonorrhea: NEGATIVE

## 2021-12-12 LAB — OB RESULTS CONSOLE HIV ANTIBODY (ROUTINE TESTING): HIV: NONREACTIVE

## 2021-12-12 LAB — OB RESULTS CONSOLE HEPATITIS B SURFACE ANTIGEN: Hepatitis B Surface Ag: NEGATIVE

## 2021-12-12 LAB — OB RESULTS CONSOLE RUBELLA ANTIBODY, IGM: Rubella: IMMUNE

## 2021-12-12 MED ORDER — ASPIRIN 81 MG PO TBEC
81.0000 mg | DELAYED_RELEASE_TABLET | Freq: Every day | ORAL | 4 refills | Status: DC
  Filled 2021-12-12: qty 30, 30d supply, fill #0
  Filled 2022-01-08: qty 30, 30d supply, fill #1
  Filled 2022-02-08: qty 30, 30d supply, fill #2
  Filled 2022-03-09: qty 30, 30d supply, fill #3
  Filled 2022-04-10: qty 30, 30d supply, fill #4
  Filled 2022-05-07: qty 30, 30d supply, fill #5
  Filled 2022-06-12: qty 30, 30d supply, fill #6

## 2021-12-25 ENCOUNTER — Telehealth: Payer: Self-pay

## 2021-12-25 NOTE — Telephone Encounter (Signed)
Patient called in voice mail but I could not be sure about her message. I asked Earnest Bailey if she would call and find out what patient's needs were.  Earnest Bailey spoke with her and patient wanted the results/HPV status of her last two Pap smears. Earnest Bailey said she relayed this information to patient.

## 2021-12-27 ENCOUNTER — Other Ambulatory Visit: Payer: Self-pay | Admitting: Obstetrics and Gynecology

## 2021-12-27 DIAGNOSIS — Z363 Encounter for antenatal screening for malformations: Secondary | ICD-10-CM

## 2022-01-08 ENCOUNTER — Other Ambulatory Visit (HOSPITAL_COMMUNITY): Payer: Self-pay

## 2022-01-09 LAB — OB RESULTS CONSOLE ABO/RH: RH Type: POSITIVE

## 2022-01-28 ENCOUNTER — Encounter: Payer: Self-pay | Admitting: *Deleted

## 2022-01-30 ENCOUNTER — Ambulatory Visit: Payer: 59

## 2022-02-04 ENCOUNTER — Ambulatory Visit (HOSPITAL_BASED_OUTPATIENT_CLINIC_OR_DEPARTMENT_OTHER): Payer: 59

## 2022-02-04 ENCOUNTER — Other Ambulatory Visit: Payer: Self-pay | Admitting: *Deleted

## 2022-02-04 ENCOUNTER — Encounter: Payer: Self-pay | Admitting: *Deleted

## 2022-02-04 ENCOUNTER — Ambulatory Visit: Payer: 59 | Attending: Obstetrics and Gynecology | Admitting: *Deleted

## 2022-02-04 DIAGNOSIS — E890 Postprocedural hypothyroidism: Secondary | ICD-10-CM | POA: Insufficient documentation

## 2022-02-04 DIAGNOSIS — O99282 Endocrine, nutritional and metabolic diseases complicating pregnancy, second trimester: Secondary | ICD-10-CM | POA: Insufficient documentation

## 2022-02-04 DIAGNOSIS — Z3A20 20 weeks gestation of pregnancy: Secondary | ICD-10-CM | POA: Insufficient documentation

## 2022-02-04 DIAGNOSIS — Z79890 Hormone replacement therapy (postmenopausal): Secondary | ICD-10-CM | POA: Diagnosis not present

## 2022-02-04 DIAGNOSIS — O99280 Endocrine, nutritional and metabolic diseases complicating pregnancy, unspecified trimester: Secondary | ICD-10-CM | POA: Diagnosis present

## 2022-02-04 DIAGNOSIS — Z363 Encounter for antenatal screening for malformations: Secondary | ICD-10-CM

## 2022-02-04 DIAGNOSIS — Z369 Encounter for antenatal screening, unspecified: Secondary | ICD-10-CM | POA: Diagnosis not present

## 2022-02-04 DIAGNOSIS — Z362 Encounter for other antenatal screening follow-up: Secondary | ICD-10-CM

## 2022-02-11 ENCOUNTER — Other Ambulatory Visit (HOSPITAL_COMMUNITY): Payer: Self-pay

## 2022-03-04 ENCOUNTER — Encounter: Payer: Self-pay | Admitting: *Deleted

## 2022-03-04 ENCOUNTER — Ambulatory Visit: Payer: 59 | Attending: Maternal & Fetal Medicine

## 2022-03-04 ENCOUNTER — Ambulatory Visit: Payer: 59 | Admitting: *Deleted

## 2022-03-04 ENCOUNTER — Other Ambulatory Visit: Payer: Self-pay | Admitting: *Deleted

## 2022-03-04 VITALS — BP 114/46 | HR 104

## 2022-03-04 DIAGNOSIS — E039 Hypothyroidism, unspecified: Secondary | ICD-10-CM | POA: Diagnosis not present

## 2022-03-04 DIAGNOSIS — O99891 Other specified diseases and conditions complicating pregnancy: Secondary | ICD-10-CM

## 2022-03-04 DIAGNOSIS — O99282 Endocrine, nutritional and metabolic diseases complicating pregnancy, second trimester: Secondary | ICD-10-CM

## 2022-03-04 DIAGNOSIS — Z362 Encounter for other antenatal screening follow-up: Secondary | ICD-10-CM | POA: Diagnosis present

## 2022-03-04 DIAGNOSIS — Z79890 Hormone replacement therapy (postmenopausal): Secondary | ICD-10-CM | POA: Diagnosis not present

## 2022-03-04 DIAGNOSIS — Z85850 Personal history of malignant neoplasm of thyroid: Secondary | ICD-10-CM

## 2022-03-04 DIAGNOSIS — O09812 Supervision of pregnancy resulting from assisted reproductive technology, second trimester: Secondary | ICD-10-CM | POA: Diagnosis not present

## 2022-03-04 DIAGNOSIS — Z3A24 24 weeks gestation of pregnancy: Secondary | ICD-10-CM

## 2022-03-11 ENCOUNTER — Other Ambulatory Visit (HOSPITAL_COMMUNITY): Payer: Self-pay

## 2022-04-03 ENCOUNTER — Ambulatory Visit: Payer: 59 | Attending: Maternal & Fetal Medicine

## 2022-04-03 ENCOUNTER — Ambulatory Visit: Payer: 59 | Admitting: *Deleted

## 2022-04-03 VITALS — BP 140/64 | HR 102

## 2022-04-03 DIAGNOSIS — O99280 Endocrine, nutritional and metabolic diseases complicating pregnancy, unspecified trimester: Secondary | ICD-10-CM

## 2022-04-03 DIAGNOSIS — Z3689 Encounter for other specified antenatal screening: Secondary | ICD-10-CM

## 2022-04-03 DIAGNOSIS — O99283 Endocrine, nutritional and metabolic diseases complicating pregnancy, third trimester: Secondary | ICD-10-CM | POA: Insufficient documentation

## 2022-04-03 DIAGNOSIS — O09813 Supervision of pregnancy resulting from assisted reproductive technology, third trimester: Secondary | ICD-10-CM | POA: Diagnosis not present

## 2022-04-03 DIAGNOSIS — Z3A28 28 weeks gestation of pregnancy: Secondary | ICD-10-CM | POA: Insufficient documentation

## 2022-04-03 DIAGNOSIS — E039 Hypothyroidism, unspecified: Secondary | ICD-10-CM | POA: Diagnosis present

## 2022-04-03 DIAGNOSIS — E890 Postprocedural hypothyroidism: Secondary | ICD-10-CM

## 2022-04-10 ENCOUNTER — Other Ambulatory Visit (HOSPITAL_COMMUNITY): Payer: Self-pay

## 2022-04-11 ENCOUNTER — Other Ambulatory Visit (HOSPITAL_COMMUNITY): Payer: Self-pay

## 2022-04-29 ENCOUNTER — Other Ambulatory Visit: Payer: Self-pay | Admitting: *Deleted

## 2022-04-29 ENCOUNTER — Ambulatory Visit: Payer: Medicaid Other | Attending: Maternal & Fetal Medicine

## 2022-04-29 ENCOUNTER — Ambulatory Visit: Payer: Medicaid Other | Admitting: *Deleted

## 2022-04-29 VITALS — BP 128/70 | HR 97

## 2022-04-29 DIAGNOSIS — Z3689 Encounter for other specified antenatal screening: Secondary | ICD-10-CM

## 2022-04-29 DIAGNOSIS — O99280 Endocrine, nutritional and metabolic diseases complicating pregnancy, unspecified trimester: Secondary | ICD-10-CM | POA: Insufficient documentation

## 2022-04-29 DIAGNOSIS — O99283 Endocrine, nutritional and metabolic diseases complicating pregnancy, third trimester: Secondary | ICD-10-CM | POA: Insufficient documentation

## 2022-04-29 DIAGNOSIS — Z85850 Personal history of malignant neoplasm of thyroid: Secondary | ICD-10-CM

## 2022-04-29 DIAGNOSIS — O09813 Supervision of pregnancy resulting from assisted reproductive technology, third trimester: Secondary | ICD-10-CM

## 2022-04-29 DIAGNOSIS — E039 Hypothyroidism, unspecified: Secondary | ICD-10-CM | POA: Insufficient documentation

## 2022-04-29 DIAGNOSIS — Z3A32 32 weeks gestation of pregnancy: Secondary | ICD-10-CM | POA: Diagnosis not present

## 2022-04-29 DIAGNOSIS — Z79890 Hormone replacement therapy (postmenopausal): Secondary | ICD-10-CM | POA: Diagnosis not present

## 2022-05-07 ENCOUNTER — Other Ambulatory Visit (HOSPITAL_COMMUNITY): Payer: Self-pay

## 2022-05-27 ENCOUNTER — Ambulatory Visit: Payer: 59 | Attending: Obstetrics and Gynecology

## 2022-05-27 ENCOUNTER — Ambulatory Visit: Payer: 59 | Admitting: *Deleted

## 2022-05-27 VITALS — BP 119/62 | HR 87

## 2022-05-27 DIAGNOSIS — Z3689 Encounter for other specified antenatal screening: Secondary | ICD-10-CM | POA: Diagnosis present

## 2022-05-27 DIAGNOSIS — O99283 Endocrine, nutritional and metabolic diseases complicating pregnancy, third trimester: Secondary | ICD-10-CM | POA: Diagnosis not present

## 2022-05-27 DIAGNOSIS — Z85850 Personal history of malignant neoplasm of thyroid: Secondary | ICD-10-CM | POA: Diagnosis present

## 2022-05-27 DIAGNOSIS — O09813 Supervision of pregnancy resulting from assisted reproductive technology, third trimester: Secondary | ICD-10-CM | POA: Diagnosis not present

## 2022-05-27 DIAGNOSIS — Z79890 Hormone replacement therapy (postmenopausal): Secondary | ICD-10-CM | POA: Diagnosis not present

## 2022-05-27 DIAGNOSIS — Z3A36 36 weeks gestation of pregnancy: Secondary | ICD-10-CM | POA: Insufficient documentation

## 2022-05-27 DIAGNOSIS — E039 Hypothyroidism, unspecified: Secondary | ICD-10-CM

## 2022-05-28 ENCOUNTER — Telehealth (HOSPITAL_COMMUNITY): Payer: Self-pay

## 2022-05-28 NOTE — Telephone Encounter (Signed)
Preadmission screening

## 2022-05-31 ENCOUNTER — Inpatient Hospital Stay (HOSPITAL_BASED_OUTPATIENT_CLINIC_OR_DEPARTMENT_OTHER): Payer: 59

## 2022-05-31 ENCOUNTER — Encounter (HOSPITAL_COMMUNITY): Payer: Self-pay | Admitting: Obstetrics and Gynecology

## 2022-05-31 ENCOUNTER — Other Ambulatory Visit: Payer: Self-pay

## 2022-05-31 ENCOUNTER — Inpatient Hospital Stay (HOSPITAL_COMMUNITY)
Admission: AD | Admit: 2022-05-31 | Discharge: 2022-05-31 | Disposition: A | Discharge status: Home / Self Care | Payer: 59 | Attending: Obstetrics and Gynecology | Admitting: Obstetrics and Gynecology

## 2022-05-31 DIAGNOSIS — O99283 Endocrine, nutritional and metabolic diseases complicating pregnancy, third trimester: Secondary | ICD-10-CM

## 2022-05-31 DIAGNOSIS — Z3A36 36 weeks gestation of pregnancy: Secondary | ICD-10-CM

## 2022-05-31 DIAGNOSIS — O368130 Decreased fetal movements, third trimester, not applicable or unspecified: Secondary | ICD-10-CM | POA: Diagnosis not present

## 2022-05-31 DIAGNOSIS — Z79890 Hormone replacement therapy (postmenopausal): Secondary | ICD-10-CM | POA: Diagnosis not present

## 2022-05-31 DIAGNOSIS — Z3493 Encounter for supervision of normal pregnancy, unspecified, third trimester: Secondary | ICD-10-CM

## 2022-05-31 DIAGNOSIS — Z85850 Personal history of malignant neoplasm of thyroid: Secondary | ICD-10-CM | POA: Insufficient documentation

## 2022-05-31 DIAGNOSIS — E039 Hypothyroidism, unspecified: Secondary | ICD-10-CM | POA: Diagnosis not present

## 2022-05-31 DIAGNOSIS — O09813 Supervision of pregnancy resulting from assisted reproductive technology, third trimester: Secondary | ICD-10-CM | POA: Insufficient documentation

## 2022-05-31 NOTE — Discharge Instructions (Signed)

## 2022-05-31 NOTE — MAU Provider Note (Signed)
History     CSN: [id number]  Arrival date and time: 05/31/22 1156   Event Date/Time   First Provider Initiated Contact with Patient 05/31/22 1221      Chief Complaint  Patient presents with   Decreased Fetal Movement   HPI  Gina Sullivan is a 36 y.o. G2P0010 at 97w5dwho presents for evaluation of decreased fetal movement. Patient reports the baby hasn't been moving normally since yesterday. She reports she does feel movement but it is not normal for her. She denies any pain.  She denies any vaginal bleeding, discharge, and leaking of fluid. Denies any constipation, diarrhea or any urinary complaints.   OB History     Gravida  2   Para  0   Term      Preterm      AB  1   Living  0      SAB  1   IAB      Ectopic      Multiple      Live Births              Past Medical History:  Diagnosis Date   Anemia    Asthma    Cancer [place]    thyroid    Hypocalcemia 01/20/2017   Papillary [facility]     Past Surgical History:  Procedure Laterality Date   THYROIDECTOMY  01/17/2017   THYROIDECTOMY Right 01/17/2017   Procedure: THYROIDECTOMY;  Surgeon: BMelida Quitter MD;  [facility];  Service: ENT;  Laterality: Right;  total thyroidectomy   THYROIDECTOMY      Family History  Problem Relation Age of Onset   Hypertension Mother    Hypertension Father     Social History   Tobacco Use   Smoking status: Never   Smokeless tobacco: Never  Vaping Use   Vaping Use: Never used  Substance Use Topics   Alcohol use: Yes    Comment: wine occ   Drug use: Never    Allergies:  Allergies  Allergen Reactions   Niacin And Related Hives    Medications Prior to Admission  Medication Sig Dispense Refill Last Dose   aspirin EC 81 MG tablet Take 1 tablet (81 mg total) by mouth daily. 90 tablet 4 05/30/2022   levothyroxine (SYNTHROID) 175 MCG tablet Take 1 tablet by mouth in the morning on an emtpy stomach monday thru saturday 26 tablet 6  05/31/2022   Prenatal Vit-Fe Fumarate-FA (PRENATAL MULTIVITAMIN) TABS tablet Take 1 tablet by mouth daily at 12 noon.   05/30/2022   acetaminophen (TYLENOL) 325 MG tablet Take 650 mg by mouth every 6 (six) hours as needed for mild pain. (Patient not taking: Reported on 03/04/2022)      ALPRAZolam (XANAX) 0.25 MG tablet Take 1 tablet (0.25 mg total) by mouth as directed 30-60 minutes before flight (Patient not taking: Reported on 07/12/2021) 10 tablet 0    ALPRAZolam (XANAX) 0.25 MG tablet Take 1 tablet (0.25 mg total) by mouth 2 (two) times daily as needed for anxiety (Patient not taking: Reported on 07/12/2021) 10 tablet 0    ALPRAZolam (XANAX) 0.25 MG tablet Take 1-2 tablets (0.25-0.5 mg total) by mouth 2 (two) times daily as needed for anxiety (Patient not taking: Reported on 02/04/2022) 10 tablet 1    Calcium Carbonate-Vitamin D (CALCIUM 600+D) 600-200 MG-UNIT TABS Take 4 tablets by mouth 3 (three) times daily. Calcium 600/vitamin d 500 (Patient not taking: Reported on 03/04/2022) 180 tablet 0  escitalopram (LEXAPRO) 10 MG tablet Take 1 tablet (10 mg total) by mouth daily. (Patient not taking: Reported on 03/04/2022) 30 tablet 1    estradiol (ESTRACE) 2 MG tablet Take 1 tablet (2 mg total) by mouth 2 (two) times daily. (Patient not taking: Reported on 02/04/2022) 60 tablet 3    estradiol (VIVELLE-DOT) 0.1 MG/24HR patch Place 1 patch (0.1 mg total) onto the skin every 3 (three) days.  Apply to lower abdomen or top of buttocks. (Patient not taking: Reported on 07/12/2021) 16 patch 3    methylPREDNISolone (MEDROL) 8 MG tablet Take 1 tablet (8 mg total) by mouth 2 (two) times daily for 4 days. (Patient not taking: Reported on 02/04/2022) 8 tablet 0    NEEDLE, DISP, 21 G 21G X 1-1/2" MISC Use as directed 30 each 3    omeprazole (PRILOSEC) 10 MG capsule Take 10 mg by mouth daily. (Patient not taking: Reported on 02/04/2022)      Prenatal Vit-Fe Fumarate-FA (PRENATAL VITAMIN PO) Take by mouth.      progesterone 50  MG/ML injection Inject 1 mL (50 mg total) into the muscle daily. (Patient not taking: Reported on 02/04/2022) 20 mL 3    progesterone 50 MG/ML injection Inject 1 mL (50 mg total) into the muscle daily for 20 days (Patient not taking: Reported on 02/04/2022) 20 mL 3    SYRINGE-NEEDLE, DISP, 3 ML (B-D 3CC LUER-LOK SYR 18GX1-1/2) 18G X 1-1/2" 3 ML MISC Use daily as directed 30 each 3     Review of Systems  Constitutional: Negative.  Negative for fatigue and fever.  HENT: Negative.    Respiratory: Negative.  Negative for shortness of breath.   Cardiovascular: Negative.  Negative for chest pain.  Gastrointestinal: Negative.  Negative for abdominal pain, constipation, diarrhea, nausea and vomiting.  Genitourinary: Negative.  Negative for dysuria, vaginal bleeding and vaginal discharge.  Neurological: Negative.  Negative for dizziness and headaches.   Physical Exam   Blood pressure 112/72, pulse 93, temperature 98.1 F (36.7 C), temperature source Oral, resp. rate 16, height '5\' 4"'$  (1.626 m), weight 95.6 kg, last menstrual period 09/30/2021, SpO2 97 %.  Patient Vitals for the past 24 hrs:  BP Temp Temp src Pulse Resp SpO2 Height Weight  05/31/22 1355 123/78 -- -- 84 -- -- -- --  05/31/22 1218 112/72 98.1 F (36.7 C) Oral 93 16 97 % -- --  05/31/22 1208 -- -- -- -- -- -- '5\' 4"'$  (1.626 m) 95.6 kg    Physical Exam Vitals and nursing note reviewed.  Constitutional:      General: She is not in acute distress.    Appearance: She is well-developed.  HENT:     Head: Normocephalic.  Eyes:     Pupils: Pupils are equal, round, and reactive to light.  Cardiovascular:     Rate and Rhythm: Normal rate and regular rhythm.     Heart sounds: Normal heart sounds.  Pulmonary:     Effort: Pulmonary effort is normal. No respiratory distress.     Breath sounds: Normal breath sounds.  Abdominal:     General: Bowel sounds are normal. There is no distension.     Palpations: Abdomen is soft.     Tenderness:  There is no abdominal tenderness.  Skin:    General: Skin is warm and dry.  Neurological:     Mental Status: She is alert and oriented to person, place, and time.  Psychiatric:        Mood and  Affect: Mood normal.        Behavior: Behavior normal.        Thought Content: Thought content normal.        Judgment: Judgment normal.     Fetal Tracing:  Baseline: 140 Variability: moderate Accels: 10x10 Decels: none  Toco: none      MAU Course  Procedures     MDM Prenatal records from community office reviewed. Pregnancy complicated by IVF pregnancy  Labs ordered and reviewed.   NST reassuring but not reactive.  [place] MFM BPP- 8/8 with normal AFI Patient reports movement is back to normal since ultrasound   Assessment and Plan   1. Movement of fetus present during pregnancy in third trimester   2. [redacted] weeks gestation of pregnancy     -Discharge home in stable condition -Fetal movement precautions discussed -Patient advised to follow-up with OB as scheduled for prenatal care -Patient may return to MAU as needed or if her condition were to change or worsen  Wende Mott, CNM 05/31/2022, 12:21 PM

## 2022-05-31 NOTE — MAU Note (Signed)
[place] [name] is a 36 y.o. at 77w5dhere in MAU reporting: DFM since yesterday, states has felt some movement but less than normal. Denies bleeding or LOF. No pain but reports pelvic pressure.  Onset of complaint: yesterday  Pain score: 0/10  Vitals:   05/31/22 1218  BP: 112/72  Pulse: 93  Resp: 16  Temp: 98.1 F (36.7 C)  SpO2: 97%     FHT:145  Lab orders placed from triage: none

## 2022-06-04 LAB — OB RESULTS CONSOLE GBS: GBS: POSITIVE

## 2022-06-13 ENCOUNTER — Other Ambulatory Visit (HOSPITAL_COMMUNITY): Payer: Self-pay

## 2022-06-19 ENCOUNTER — Inpatient Hospital Stay (HOSPITAL_COMMUNITY)
Admission: AD | Admit: 2022-06-19 | Discharge: 2022-06-22 | DRG: 788 | Disposition: A | Discharge status: Home / Self Care | Payer: 59 | Attending: Obstetrics and Gynecology | Admitting: Obstetrics and Gynecology

## 2022-06-19 ENCOUNTER — Inpatient Hospital Stay (HOSPITAL_COMMUNITY): Payer: 59 | Admitting: Anesthesiology

## 2022-06-19 ENCOUNTER — Other Ambulatory Visit: Payer: Self-pay

## 2022-06-19 ENCOUNTER — Encounter (HOSPITAL_COMMUNITY): Payer: Self-pay | Admitting: Obstetrics and Gynecology

## 2022-06-19 DIAGNOSIS — E890 Postprocedural hypothyroidism: Secondary | ICD-10-CM | POA: Diagnosis present

## 2022-06-19 DIAGNOSIS — Z85850 Personal history of malignant neoplasm of thyroid: Secondary | ICD-10-CM | POA: Diagnosis not present

## 2022-06-19 DIAGNOSIS — O99824 Streptococcus B carrier state complicating childbirth: Secondary | ICD-10-CM | POA: Diagnosis present

## 2022-06-19 DIAGNOSIS — Z98891 History of uterine scar from previous surgery: Principal | ICD-10-CM

## 2022-06-19 DIAGNOSIS — Z3A39 39 weeks gestation of pregnancy: Secondary | ICD-10-CM

## 2022-06-19 DIAGNOSIS — O4292 Full-term premature rupture of membranes, unspecified as to length of time between rupture and onset of labor: Secondary | ICD-10-CM | POA: Diagnosis present

## 2022-06-19 DIAGNOSIS — O9952 Diseases of the respiratory system complicating childbirth: Secondary | ICD-10-CM | POA: Diagnosis not present

## 2022-06-19 DIAGNOSIS — O99284 Endocrine, nutritional and metabolic diseases complicating childbirth: Secondary | ICD-10-CM | POA: Diagnosis present

## 2022-06-19 DIAGNOSIS — O26893 Other specified pregnancy related conditions, third trimester: Secondary | ICD-10-CM | POA: Diagnosis present

## 2022-06-19 DIAGNOSIS — J45909 Unspecified asthma, uncomplicated: Secondary | ICD-10-CM | POA: Diagnosis not present

## 2022-06-19 LAB — COMPREHENSIVE METABOLIC PANEL
ALT: 15 U/L (ref 0–44)
AST: 22 U/L (ref 15–41)
Albumin: 2.9 g/dL — ABNORMAL LOW (ref 3.5–5.0)
Alkaline Phosphatase: 129 U/L — ABNORMAL HIGH (ref 38–126)
Anion gap: 17 — ABNORMAL HIGH (ref 5–15)
BUN: 9 mg/dL (ref 6–20)
CO2: 19 mmol/L — ABNORMAL LOW (ref 22–32)
Calcium: 9.5 mg/dL (ref 8.9–10.3)
Chloride: 102 mmol/L (ref 98–111)
Creatinine, Ser: 0.73 mg/dL (ref 0.44–1.00)
GFR, Estimated: >60 mL/min (ref >60)
Glucose, Bld: 96 mg/dL (ref 70–99)
Potassium: 4.2 mmol/L (ref 3.5–5.1)
Sodium: 138 mmol/L (ref 135–145)
Total Bilirubin: 0.3 mg/dL (ref 0.3–1.2)
Total Protein: 6.2 g/dL — ABNORMAL LOW (ref 6.5–8.1)

## 2022-06-19 LAB — CBC
HCT: 42.6 % (ref 36.0–46.0)
Hemoglobin: 14.4 g/dL (ref 12.0–15.0)
MCH: 30.7 pg (ref 26.0–34.0)
MCHC: 33.8 g/dL (ref 30.0–36.0)
MCV: 90.8 fL (ref 80.0–100.0)
Platelets: 228 10*3/uL (ref 150–400)
RBC: 4.69 MIL/uL (ref 3.87–5.11)
RDW: 12.8 % (ref 11.5–15.5)
WBC: 7.2 10*3/uL (ref 4.0–10.5)
nRBC: 0 % (ref 0.0–0.2)

## 2022-06-19 LAB — HIV ANTIBODY (ROUTINE TESTING W REFLEX): HIV Screen 4th Generation wRfx: NONREACTIVE

## 2022-06-19 LAB — TYPE AND SCREEN
ABO/RH(D): A POS
Antibody Screen: NEGATIVE

## 2022-06-19 LAB — RPR: RPR Ser Ql: NONREACTIVE

## 2022-06-19 MED ORDER — OXYTOCIN-SODIUM CHLORIDE 30-0.9 UT/500ML-% IV SOLN
1.0000 m[IU]/min | INTRAVENOUS | Status: DC
  Administered 2022-06-19: 2 m[IU]/min via INTRAVENOUS
  Administered 2022-06-20: 14 m[IU]/min via INTRAVENOUS
  Filled 2022-06-19 (×2): qty 500

## 2022-06-19 MED ORDER — LACTATED RINGERS IV SOLN
500.0000 mL | INTRAVENOUS | Status: DC | PRN
  Administered 2022-06-19 – 2022-06-20 (×3): 500 mL via INTRAVENOUS

## 2022-06-19 MED ORDER — PENICILLIN G POT IN DEXTROSE 60000 UNIT/ML IV SOLN
3.0000 10*6.[IU] | INTRAVENOUS | Status: DC
  Administered 2022-06-19 – 2022-06-20 (×9): 3 10*6.[IU] via INTRAVENOUS
  Filled 2022-06-19 (×9): qty 50

## 2022-06-19 MED ORDER — PHENYLEPHRINE 80 MCG/ML (10ML) SYRINGE FOR IV PUSH (FOR BLOOD PRESSURE SUPPORT): 80.0000 ug | PREFILLED_SYRINGE | INTRAVENOUS | Status: DC | PRN

## 2022-06-19 MED ORDER — LACTATED RINGERS IV SOLN: INTRAVENOUS | Status: DC

## 2022-06-19 MED ORDER — TERBUTALINE SULFATE 1 MG/ML IJ SOLN: 0.2500 mg | Freq: Once | INTRAMUSCULAR | Status: DC | PRN

## 2022-06-19 MED ORDER — ACETAMINOPHEN 325 MG PO TABS: 650.0000 mg | ORAL_TABLET | ORAL | Status: DC | PRN

## 2022-06-19 MED ORDER — DIPHENHYDRAMINE HCL 50 MG/ML IJ SOLN: 12.5000 mg | INTRAMUSCULAR | Status: DC | PRN

## 2022-06-19 MED ORDER — FLEET ENEMA 7-19 GM/118ML RE ENEM: 1.0000 | ENEMA | RECTAL | Status: DC | PRN

## 2022-06-19 MED ORDER — SODIUM CHLORIDE 0.9 % IV SOLN
5.0000 10*6.[IU] | Freq: Once | INTRAVENOUS | Status: AC
  Administered 2022-06-19: 5 10*6.[IU] via INTRAVENOUS
  Filled 2022-06-19: qty 5

## 2022-06-19 MED ORDER — FENTANYL-BUPIVACAINE-NACL 0.5-0.125-0.9 MG/250ML-% EP SOLN
EPIDURAL | Status: DC | PRN
  Administered 2022-06-19: 12 mL/h via EPIDURAL
  Administered 2022-06-20: 500 ug via EPIDURAL

## 2022-06-19 MED ORDER — LIDOCAINE HCL (PF) 1 % IJ SOLN: 30.0000 mL | INTRAMUSCULAR | Status: DC | PRN

## 2022-06-19 MED ORDER — LIDOCAINE HCL (PF) 1 % IJ SOLN
INTRAMUSCULAR | Status: DC | PRN
  Administered 2022-06-19: 5 mL via EPIDURAL

## 2022-06-19 MED ORDER — OXYCODONE-ACETAMINOPHEN 5-325 MG PO TABS: 1.0000 | ORAL_TABLET | ORAL | Status: DC | PRN

## 2022-06-19 MED ORDER — FENTANYL-BUPIVACAINE-NACL 0.5-0.125-0.9 MG/250ML-% EP SOLN
12.0000 mL/h | EPIDURAL | Status: DC | PRN
  Administered 2022-06-20: 12 mL/h via EPIDURAL
  Filled 2022-06-19 (×3): qty 250

## 2022-06-19 MED ORDER — OXYTOCIN BOLUS FROM INFUSION: 333.0000 mL | Freq: Once | INTRAVENOUS | Status: DC

## 2022-06-19 MED ORDER — OXYTOCIN-SODIUM CHLORIDE 30-0.9 UT/500ML-% IV SOLN
2.5000 [IU]/h | INTRAVENOUS | Status: DC
  Administered 2022-06-20: 15 [IU]/h via INTRAVENOUS

## 2022-06-19 MED ORDER — LEVOTHYROXINE SODIUM 75 MCG PO TABS
175.0000 ug | ORAL_TABLET | Freq: Every day | ORAL | Status: DC
  Administered 2022-06-19 – 2022-06-20 (×2): 175 ug via ORAL
  Filled 2022-06-19 (×4): qty 1

## 2022-06-19 MED ORDER — FENTANYL CITRATE (PF) 100 MCG/2ML IJ SOLN
50.0000 ug | INTRAMUSCULAR | Status: DC | PRN
  Administered 2022-06-19 (×4): 100 ug via INTRAVENOUS
  Filled 2022-06-19 (×4): qty 2

## 2022-06-19 MED ORDER — LACTATED RINGERS IV SOLN
500.0000 mL | Freq: Once | INTRAVENOUS | Status: AC
  Administered 2022-06-19: 500 mL via INTRAVENOUS

## 2022-06-19 MED ORDER — SOD CITRATE-CITRIC ACID 500-334 MG/5ML PO SOLN
30.0000 mL | ORAL | Status: DC | PRN
  Administered 2022-06-20: 30 mL via ORAL
  Filled 2022-06-19: qty 30

## 2022-06-19 MED ORDER — EPHEDRINE 5 MG/ML INJ: 10.0000 mg | INTRAVENOUS | Status: DC | PRN

## 2022-06-19 MED ORDER — OXYCODONE-ACETAMINOPHEN 5-325 MG PO TABS: 2.0000 | ORAL_TABLET | ORAL | Status: DC | PRN

## 2022-06-19 MED ORDER — ONDANSETRON HCL 4 MG/2ML IJ SOLN
4.0000 mg | Freq: Four times a day (QID) | INTRAMUSCULAR | Status: DC | PRN
  Administered 2022-06-19: 4 mg via INTRAVENOUS
  Filled 2022-06-19: qty 2

## 2022-06-19 NOTE — Anesthesia Preprocedure Evaluation (Signed)
Anesthesia Evaluation  Patient identified by MRN, date of birth, ID band Patient awake    Reviewed: Allergy & Precautions, NPO status , Patient's Chart, lab work & pertinent test results  Airway Mallampati: II  TM Distance: >3 FB Neck ROM: Full    Dental no notable dental hx.    Pulmonary asthma    Pulmonary exam normal breath sounds clear to auscultation       Cardiovascular Normal cardiovascular exam Rhythm:Regular Rate:Normal     Neuro/Psych    GI/Hepatic   Endo/Other  Hypothyroidism    Renal/GU      Musculoskeletal   Abdominal  (+) + obese (BMI 37.1)  Peds  Hematology Lab Results      Component                Value               Date                             HGB                      14.4                06/19/2022                HCT                      42.6                06/19/2022                  PLT                      228                 06/19/2022              Anesthesia Other Findings   Reproductive/Obstetrics (+) Pregnancy                             Anesthesia Physical Anesthesia Plan  ASA: 3  Anesthesia Plan: Epidural   Post-op Pain Management:    Induction:   PONV Risk Score and Plan:   Airway Management Planned:   Additional Equipment:   Intra-op Plan:   Post-operative Plan:   Informed Consent: I have reviewed the patients History and Physical, chart, labs and discussed the procedure including the risks, benefits and alternatives for the proposed anesthesia with the patient or authorized representative who has indicated his/her understanding and acceptance.       Plan Discussed with:   Anesthesia Plan Comments: (39.2 wK g2p0 forLEA)       Anesthesia Quick Evaluation

## 2022-06-19 NOTE — Progress Notes (Signed)
Comfortable with epidural Afeb, VSS FHT-130s, Cat I, ctx q 2-3 min on 12 mu/min pitocin VE-3-4/50/-1 per RN Continue PCN for +GBS, continue pitocin, monitor progress, anticipate SVD

## 2022-06-19 NOTE — MAU Note (Signed)
Pt says UC's strong since 0013 VE in office -closed last week Has hx of HSV- none during preg- not Rx Valtrex  GBS- neg

## 2022-06-19 NOTE — H&P (Signed)
Gina Sullivan is a 36 y.o. female, G2 P0010, EGA 39+ weeks with [facility] 11-26 presenting for leaking fluid and ctx.  Eval in MAU confirmed ROM, VE 1 cm, she has increasing ctx.  PNC complicated by h/o thyroid cancer and thyroidectomy, on synthroid and followed by Gina Sullivan.  IVF pregnancy, normal fetal ECHO, normal antenatal surveillance.  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 Social History:  reports that she has never smoked. She has never used smokeless tobacco. She reports current alcohol use. She reports that she does not use drugs.     Maternal Diabetes: No Genetic Screening: Normal Maternal Ultrasounds/Referrals: Normal Fetal Ultrasounds or other Referrals:  Fetal echo Maternal Substance Abuse:  No Significant Maternal Medications:  Meds include: Syntroid Significant Maternal Lab Results:  Group B Strep positive Number of Prenatal Visits:greater than 3 verified prenatal visits Other Comments:  None  Review of Systems  Respiratory: Negative.    Cardiovascular: Negative.    Maternal Medical History:  Reason for admission: Rupture of membranes and contractions.   Contractions: Frequency: irregular.   Perceived severity is moderate.   Fetal activity: Perceived fetal activity is normal.   Prenatal  Complications - Diabetes: none.   Dilation: 1 Effacement (%): 80 Station: -2 Exam by:: Rosana Hoes, RN Blood pressure (!) 137/97, pulse (!) 103, temperature 97.8 F (36.6 C), temperature source Oral, resp. rate 18, height '5\' 4"'$  (1.626 m), weight 98.2 kg, last menstrual period 09/30/2021, SpO2 97 %. Maternal Exam:  Uterine Assessment: Contraction strength is moderate.  Contraction frequency is regular.  Abdomen: Patient reports no abdominal tenderness. Estimated fetal weight is 7.5 lbs.   Fetal presentation: vertex Introitus: Normal vulva. Normal vagina.  Ferning test: positive.  Amniotic fluid character: clear. Pelvis: adequate for delivery.     Fetal Exam Fetal Monitor Review: Mode: ultrasound.   Baseline rate: 130.  Variability: moderate (6-25 bpm).   Pattern: accelerations present and no decelerations.   Fetal State Assessment: Category I - tracings are normal.   Physical Exam Vitals reviewed.  Constitutional:      Appearance: Normal appearance.  Cardiovascular:     Rate and Rhythm: Normal rate and regular rhythm.  Pulmonary:     Effort: Pulmonary effort is normal. No respiratory distress.  Abdominal:     Palpations: Abdomen is soft.  Genitourinary:    General: Normal vulva.  Neurological:     Mental Status: She is alert.     Prenatal labs: ABO, Rh: --/--/A POS (11/22 9563) Antibody: NEG (  11/22 0318) Rubella: Immune (05/17 0000) RPR: Nonreactive (05/17 0000)  HBsAg: Negative (05/17 0000)  HIV: Non-reactive (05/17 0000)  GBS:   pos  Assessment/Plan: IUP at 39+ weeks with SROM in latent labor, GBS pos.  Admitted, getting and epidural, contracting well for now on her own, PCN for +GBS.  Will monitor progress, anticipate SVD   [name] 06/19/2022, 9:19 AM

## 2022-06-19 NOTE — Anesthesia Procedure Notes (Signed)
Epidural Patient location during procedure: OB Start time: 06/19/2022 9:21 AM End time: 06/19/2022 9:36 AM  Staffing Anesthesiologist: Barnet Glasgow, MD Performed: anesthesiologist   Preanesthetic Checklist Completed: patient identified, IV checked, site marked, risks and benefits discussed, surgical consent, monitors and equipment checked, pre-op evaluation and timeout performed  Epidural Patient position: sitting Prep: DuraPrep and site prepped and draped Patient monitoring: continuous pulse ox and blood pressure Approach: midline Injection technique: LOR air  Needle:  Needle type: Tuohy  Needle gauge: 17 G Needle length: 9 cm and 9 Needle insertion depth: 7 cm Catheter type: closed end flexible Catheter size: 19 Gauge Catheter at skin depth: 13 cm Test dose: negative  Assessment Events: blood not aspirated, injection not painful, no injection resistance, no paresthesia and negative IV test  Additional Notes Patient identified. Risks/Benefits/Options discussed with patient including but not limited to bleeding, infection, nerve damage, paralysis, failed block, incomplete pain control, headache, blood pressure changes, nausea, vomiting, reactions to medication both or allergic, itching and postpartum back pain. Confirmed with bedside nurse the patient's most recent platelet count. Confirmed with patient that they are not currently taking any anticoagulation, have any bleeding history or any family history of bleeding disorders. Patient expressed understanding and wished to proceed. All questions were answered. Sterile technique was used throughout the entire procedure. Please see nursing notes for vital signs. Test dose was given through epidural needle and negative prior to continuing to dose epidural or start infusion. Warning signs of high block given to the patient including shortness of breath, tingling/numbness in hands, complete motor block, or any concerning symptoms  with instructions to call for help. Patient was given instructions on fall risk and not to get out of bed. All questions and concerns addressed with instructions to call with any issues.  1 Attempt (S) . Patient tolerated procedure well.

## 2022-06-19 NOTE — MAU Provider Note (Signed)
History     CSN: [id number]  Arrival date and time: 06/19/22 0212   None     Chief Complaint  Patient presents with   Contractions   HPI Gina Sullivan is a 36 y.o. G2P0010 at 79w3dwho presents to MAU for contractions. She reports contractions started yesterday however around midnight became more frequent and painful. She reports when she got on the bed in the room she felt a gush of fluid that continues to trickle out with each contraction. She denies vaginal bleeding, headaches, or vision changes. She endorses active fetal movement.   [facility] been [facility]com[facility].   OB History     Gravida  2   Para  0   Term      Preterm      AB  1   Living  0      SAB  1   IAB      Ectopic      Multiple      Live Births              Past Medical History:  Diagnosis Date   Anemia    Asthma    [place]    thyroid    Hypocalcemia 01/20/2017   Pap[facility]     Past Surgical History:  Procedure Laterality Date   THYROIDECTOMY  01/17/2017   THYROIDECTOMY Right 01/17/2017   Procedure: THYROIDECTOMY;  Surgeon: BMelida Quitter MD;  [facility]  Service: ENT;  Laterality: Right;  total thyroidectomy   THYROIDECTOMY      Family History  Problem Relation Age of Onset   Hypertension Mother    Hypertension Father     Social History   Tobacco Use   Smoking status: Never   Smokeless tobacco: Never  Vaping Use   Vaping Use: Never used  Substance Use Topics   Alcohol use: Yes    Comment: wine occ   Drug use: Never    Allergies:  Allergies  Allergen Reactions   Niacin And Related Hives    Medications Prior to Admission  Medication Sig Dispense Refill Last Dose   aspirin EC 81 MG tablet Take 1 tablet (81 mg total) by mouth daily. 90 tablet 4 06/18/2022   levothyroxine (SYNTHROID) 175 MCG tablet Take 1 tablet by mouth in the morning on an emtpy stomach  monday thru saturday 26 tablet 6 06/18/2022   Prenatal Vit-Fe Fumarate-FA (PRENATAL MULTIVITAMIN) TABS tablet Take 1 tablet by mouth daily at 12 noon.   06/18/2022   acetaminophen (TYLENOL) 325 MG tablet Take 650 mg by mouth every 6 (six) hours as needed for mild pain. (Patient not taking: Reported on 03/04/2022)      ALPRAZolam (XANAX) 0.25 MG tablet Take 1 tablet (0.25 mg total) by mouth as directed 30-60 minutes before flight (Patient not taking: Reported on 07/12/2021) 10 tablet 0    ALPRAZolam (XANAX) 0.25 MG tablet Take 1 tablet (0.25 mg total) by mouth 2 (two) times daily as needed for anxiety (Patient not taking: Reported on 07/12/2021) 10 tablet 0    ALPRAZolam (XANAX) 0.25 MG tablet Take 1-2 tablets (0.25-0.5 mg total) by mouth 2 (two) times daily as needed for anxiety (Patient not taking: Reported on 02/04/2022) 10 tablet 1    Calcium Carbonate-Vitamin D (CALCIUM 600+D) 600-200 MG-UNIT TABS Take 4 tablets by mouth 3 (three) times daily. Calcium 600/vitamin d 500 (Patient not taking: Reported on  03/04/2022) 180 tablet 0    escitalopram (LEXAPRO) 10 MG tablet Take 1 tablet (10 mg total) by mouth daily. (Patient not taking: Reported on 03/04/2022) 30 tablet 1    estradiol (ESTRACE) 2 MG tablet Take 1 tablet (2 mg total) by mouth 2 (two) times daily. (Patient not taking: Reported on 02/04/2022) 60 tablet 3    estradiol (VIVELLE-DOT) 0.1 MG/24HR patch Place 1 patch (0.1 mg total) onto the skin every 3 (three) days.  Apply to lower abdomen or top of buttocks. (Patient not taking: Reported on 07/12/2021) 16 patch 3    methylPREDNISolone (MEDROL) 8 MG tablet Take 1 tablet (8 mg total) by mouth 2 (two) times daily for 4 days. (Patient not taking: Reported on 02/04/2022) 8 tablet 0    NEEDLE, DISP, 21 G 21G X 1-1/2" MISC Use as directed 30 each 3    omeprazole (PRILOSEC) 10 MG capsule Take 10 mg by mouth daily. (Patient not taking: Reported on 02/04/2022)      Prenatal Vit-Fe Fumarate-FA (PRENATAL VITAMIN PO)  Take by mouth.      progesterone 50 MG/ML injection Inject 1 mL (50 mg total) into the muscle daily. (Patient not taking: Reported on 02/04/2022) 20 mL 3    progesterone 50 MG/ML injection Inject 1 mL (50 mg total) into the muscle daily for 20 days (Patient not taking: Reported on 02/04/2022) 20 mL 3    SYRINGE-NEEDLE, DISP, 3 ML (B-D 3CC LUER-LOK SYR 18GX1-1/2) 18G X 1-1/2" 3 ML MISC Use daily as directed 30 each 3    Review of Systems  Constitutional: Negative.   Respiratory: Negative.    Cardiovascular: Negative.   Gastrointestinal:  Positive for abdominal pain (contractions).  Genitourinary:  Positive for vaginal discharge.  Neurological: Negative.    Physical Exam  Patient Vitals for the past 24 hrs:  BP Temp Temp src Pulse Resp Height Weight  06/19/22 0250 (!) 143/93 -- -- 89 -- -- --  06/19/22 0232 127/88 97.7 F (36.5 C) Oral 93 18 '5\' 4"'$  (1.626 m) 98.2 kg   Physical Exam Vitals and nursing note reviewed. Exam conducted with a chaperone present.  Constitutional:      General: She is not in acute distress.    Comments: Breathing well through contractions   Eyes:     Pupils: Pupils are equal, round, and reactive to light.  Cardiovascular:     Rate and Rhythm: Normal rate.  Pulmonary:     Effort: Pulmonary effort is normal.  Abdominal:     Palpations: Abdomen is soft.     Tenderness: There is no abdominal tenderness.     Comments: Gravid   Genitourinary:    Comments: Normal external female genitalia without lesions, vaginal walls pink with rugae, no lesions noted along vaginal walls, moderate amount of pink-tinged fluid pooling, no bleeding, cervix visually 1cm without lesions/masses Musculoskeletal:        General: Normal range of motion.     Cervical back: Normal range of motion.  Skin:    General: Skin is warm.  Neurological:     General: No focal deficit present.     Mental Status: She is alert and oriented to person, place, and time.  Psychiatric:        Mood  and Affect: Mood normal.        Behavior: Behavior normal.   Dilation: 1 Effacement (%): 60 Cervical Position: Posterior Station: -3 Presentation: Vertex Exam by:: Herb Grays, RN  NST FHR: 130 bpm, moderate variability, +15x15  accels, no decels Toco: Q2-4 mins  Fern slide: positive MAU Course  Procedures  MDM Sterile spec exam with +pooling of amniotic fluid, fern slide positive. No evidence of HSV lesions on exam. NST reactive and reassuring. Cervical exam performed by RN prior to CNM doing exam.   Assessment and Plan  [redacted] weeks gestation of pregnancy Ruptured membranes  - Admit to L&D - RN to call OB on call for admission orders   Renee Harder, CNM 06/19/2022, 3:20 AM

## 2022-06-20 ENCOUNTER — Encounter (HOSPITAL_COMMUNITY)
Admission: AD | Disposition: A | Discharge status: Home / Self Care | Payer: Self-pay | Source: Home / Self Care | Attending: Obstetrics and Gynecology

## 2022-06-20 ENCOUNTER — Encounter (HOSPITAL_COMMUNITY): Payer: Self-pay | Admitting: Obstetrics and Gynecology

## 2022-06-20 DIAGNOSIS — O9952 Diseases of the respiratory system complicating childbirth: Secondary | ICD-10-CM

## 2022-06-20 DIAGNOSIS — Z3A39 39 weeks gestation of pregnancy: Secondary | ICD-10-CM

## 2022-06-20 DIAGNOSIS — Z98891 History of uterine scar from previous surgery: Secondary | ICD-10-CM

## 2022-06-20 DIAGNOSIS — J45909 Unspecified asthma, uncomplicated: Secondary | ICD-10-CM

## 2022-06-20 SURGERY — Surgical Case
Anesthesia: Epidural

## 2022-06-20 MED ORDER — TRANEXAMIC ACID-NACL 1000-0.7 MG/100ML-% IV SOLN
INTRAVENOUS | Status: DC | PRN
  Administered 2022-06-20: 1000 mg via INTRAVENOUS

## 2022-06-20 MED ORDER — ENOXAPARIN SODIUM 40 MG/0.4ML IJ SOSY: 40.0000 mg | PREFILLED_SYRINGE | INTRAMUSCULAR | Status: DC

## 2022-06-20 MED ORDER — IBUPROFEN 600 MG PO TABS: 600.0000 mg | ORAL_TABLET | Freq: Four times a day (QID) | ORAL | Status: DC

## 2022-06-20 MED ORDER — MEPERIDINE HCL 25 MG/ML IJ SOLN: 6.2500 mg | INTRAMUSCULAR | Status: DC | PRN

## 2022-06-20 MED ORDER — SCOPOLAMINE 1 MG/3DAYS TD PT72
MEDICATED_PATCH | TRANSDERMAL | Status: AC
  Filled 2022-06-20: qty 1

## 2022-06-20 MED ORDER — ONDANSETRON HCL 4 MG/2ML IJ SOLN: 4.0000 mg | Freq: Once | INTRAMUSCULAR | Status: DC | PRN

## 2022-06-20 MED ORDER — SIMETHICONE 80 MG PO CHEW
80.0000 mg | CHEWABLE_TABLET | Freq: Three times a day (TID) | ORAL | Status: DC
  Administered 2022-06-21 – 2022-06-22 (×4): 80 mg via ORAL
  Filled 2022-06-20 (×5): qty 1

## 2022-06-20 MED ORDER — FENTANYL CITRATE (PF) 100 MCG/2ML IJ SOLN
INTRAMUSCULAR | Status: AC
  Filled 2022-06-20: qty 2

## 2022-06-20 MED ORDER — OXYCODONE HCL 5 MG/5ML PO SOLN: 5.0000 mg | Freq: Once | ORAL | Status: DC | PRN

## 2022-06-20 MED ORDER — FENTANYL CITRATE (PF) 100 MCG/2ML IJ SOLN
INTRAMUSCULAR | Status: DC | PRN
  Administered 2022-06-20: 100 ug via EPIDURAL

## 2022-06-20 MED ORDER — KETOROLAC TROMETHAMINE 30 MG/ML IJ SOLN: 30.0000 mg | Freq: Four times a day (QID) | INTRAMUSCULAR | Status: DC | PRN

## 2022-06-20 MED ORDER — NALOXONE HCL 4 MG/10ML IJ SOLN: 1.0000 ug/kg/h | INTRAVENOUS | Status: DC | PRN

## 2022-06-20 MED ORDER — MORPHINE SULFATE (PF) 0.5 MG/ML IJ SOLN
INTRAMUSCULAR | Status: AC
  Filled 2022-06-20: qty 10

## 2022-06-20 MED ORDER — KETOROLAC TROMETHAMINE 30 MG/ML IJ SOLN
INTRAMUSCULAR | Status: AC
  Filled 2022-06-20: qty 1

## 2022-06-20 MED ORDER — ACETAMINOPHEN 500 MG PO TABS: 1000.0000 mg | ORAL_TABLET | Freq: Four times a day (QID) | ORAL | Status: DC

## 2022-06-20 MED ORDER — DIPHENHYDRAMINE HCL 25 MG PO CAPS: 25.0000 mg | ORAL_CAPSULE | Freq: Four times a day (QID) | ORAL | Status: DC | PRN

## 2022-06-20 MED ORDER — ZOLPIDEM TARTRATE 5 MG PO TABS: 5.0000 mg | ORAL_TABLET | Freq: Every evening | ORAL | Status: DC | PRN

## 2022-06-20 MED ORDER — MORPHINE SULFATE (PF) 0.5 MG/ML IJ SOLN
INTRAMUSCULAR | Status: DC | PRN
  Administered 2022-06-20: 3 mg via EPIDURAL

## 2022-06-20 MED ORDER — COCONUT OIL OIL: 1.0000 | TOPICAL_OIL | Status: DC | PRN

## 2022-06-20 MED ORDER — ACETAMINOPHEN 500 MG PO TABS
1000.0000 mg | ORAL_TABLET | Freq: Four times a day (QID) | ORAL | Status: DC
  Administered 2022-06-21 – 2022-06-22 (×6): 1000 mg via ORAL
  Filled 2022-06-20 (×6): qty 2

## 2022-06-20 MED ORDER — OXYTOCIN-SODIUM CHLORIDE 30-0.9 UT/500ML-% IV SOLN
2.5000 [IU]/h | INTRAVENOUS | Status: AC
  Administered 2022-06-21 (×2): 2.5 [IU]/h via INTRAVENOUS
  Filled 2022-06-20: qty 500

## 2022-06-20 MED ORDER — KETOROLAC TROMETHAMINE 30 MG/ML IJ SOLN
30.0000 mg | Freq: Four times a day (QID) | INTRAMUSCULAR | Status: DC | PRN
  Administered 2022-06-20: 30 mg via INTRAVENOUS

## 2022-06-20 MED ORDER — MISOPROSTOL 200 MCG PO TABS
800.0000 ug | ORAL_TABLET | Freq: Once | ORAL | Status: AC
  Administered 2022-06-20: 800 ug via VAGINAL

## 2022-06-20 MED ORDER — ONDANSETRON HCL 4 MG/2ML IJ SOLN
INTRAMUSCULAR | Status: DC | PRN
  Administered 2022-06-20: 4 mg via INTRAVENOUS

## 2022-06-20 MED ORDER — WITCH HAZEL-GLYCERIN EX PADS: 1.0000 | MEDICATED_PAD | CUTANEOUS | Status: DC | PRN

## 2022-06-20 MED ORDER — LIDOCAINE-EPINEPHRINE (PF) 2 %-1:200000 IJ SOLN
INTRAMUSCULAR | Status: DC | PRN
  Administered 2022-06-20: 10 mL via EPIDURAL

## 2022-06-20 MED ORDER — DIBUCAINE (PERIANAL) 1 % EX OINT: 1.0000 | TOPICAL_OINTMENT | CUTANEOUS | Status: DC | PRN

## 2022-06-20 MED ORDER — OXYCODONE HCL 5 MG PO TABS: 5.0000 mg | ORAL_TABLET | Freq: Once | ORAL | Status: DC | PRN

## 2022-06-20 MED ORDER — SIMETHICONE 80 MG PO CHEW: 80.0000 mg | CHEWABLE_TABLET | ORAL | Status: DC | PRN

## 2022-06-20 MED ORDER — DIPHENHYDRAMINE HCL 50 MG/ML IJ SOLN: 12.5000 mg | INTRAMUSCULAR | Status: DC | PRN

## 2022-06-20 MED ORDER — IBUPROFEN 600 MG PO TABS
600.0000 mg | ORAL_TABLET | Freq: Four times a day (QID) | ORAL | Status: DC
  Administered 2022-06-21 – 2022-06-22 (×6): 600 mg via ORAL
  Filled 2022-06-20 (×6): qty 1

## 2022-06-20 MED ORDER — MENTHOL 3 MG MT LOZG: 1.0000 | LOZENGE | OROMUCOSAL | Status: DC | PRN

## 2022-06-20 MED ORDER — NALOXONE HCL 0.4 MG/ML IJ SOLN: 0.4000 mg | INTRAMUSCULAR | Status: DC | PRN

## 2022-06-20 MED ORDER — OXYCODONE HCL 5 MG PO TABS: 5.0000 mg | ORAL_TABLET | ORAL | Status: DC | PRN

## 2022-06-20 MED ORDER — HYDROMORPHONE HCL 1 MG/ML IJ SOLN: 0.2500 mg | INTRAMUSCULAR | Status: DC | PRN

## 2022-06-20 MED ORDER — SCOPOLAMINE 1 MG/3DAYS TD PT72
1.0000 | MEDICATED_PATCH | Freq: Once | TRANSDERMAL | Status: DC
  Administered 2022-06-20: 1.5 mg via TRANSDERMAL

## 2022-06-20 MED ORDER — MISOPROSTOL 200 MCG PO TABS
ORAL_TABLET | ORAL | Status: AC
  Filled 2022-06-20: qty 4

## 2022-06-20 MED ORDER — LACTATED RINGERS IV SOLN: INTRAVENOUS | Status: DC

## 2022-06-20 MED ORDER — ONDANSETRON HCL 4 MG/2ML IJ SOLN: 4.0000 mg | Freq: Three times a day (TID) | INTRAMUSCULAR | Status: DC | PRN

## 2022-06-20 MED ORDER — CEFAZOLIN SODIUM-DEXTROSE 2-3 GM-%(50ML) IV SOLR
INTRAVENOUS | Status: DC | PRN
  Administered 2022-06-20: 2 g via INTRAVENOUS

## 2022-06-20 MED ORDER — DEXAMETHASONE SODIUM PHOSPHATE 10 MG/ML IJ SOLN
INTRAMUSCULAR | Status: DC | PRN
  Administered 2022-06-20: 4 mg via INTRAVENOUS

## 2022-06-20 MED ORDER — PRENATAL MULTIVITAMIN CH
1.0000 | ORAL_TABLET | Freq: Every day | ORAL | Status: DC
  Administered 2022-06-22: 1 via ORAL
  Filled 2022-06-20 (×2): qty 1

## 2022-06-20 MED ORDER — DIPHENHYDRAMINE HCL 25 MG PO CAPS: 25.0000 mg | ORAL_CAPSULE | ORAL | Status: DC | PRN

## 2022-06-20 MED ORDER — SENNOSIDES-DOCUSATE SODIUM 8.6-50 MG PO TABS
2.0000 | ORAL_TABLET | Freq: Every day | ORAL | Status: DC
  Administered 2022-06-21 – 2022-06-22 (×2): 2 via ORAL
  Filled 2022-06-20 (×2): qty 2

## 2022-06-20 MED ORDER — SODIUM CHLORIDE 0.9 % IV SOLN
INTRAVENOUS | Status: DC | PRN
  Administered 2022-06-20: 500 mg via INTRAVENOUS

## 2022-06-20 MED ORDER — SODIUM CHLORIDE 0.9% FLUSH: 3.0000 mL | INTRAVENOUS | Status: DC | PRN

## 2022-06-20 MED ORDER — KETOROLAC TROMETHAMINE 30 MG/ML IJ SOLN: 30.0000 mg | Freq: Once | INTRAMUSCULAR | Status: DC | PRN

## 2022-06-20 SURGICAL SUPPLY — 36 items
BENZOIN TINCTURE PRP APPL 2/3 (GAUZE/BANDAGES/DRESSINGS) IMPLANT
CHLORAPREP W/TINT 26 (MISCELLANEOUS) ×2 IMPLANT
CLAMP UMBILICAL CORD (MISCELLANEOUS) ×1 IMPLANT
CLOTH BEACON ORANGE TIMEOUT ST (SAFETY) ×1 IMPLANT
DRSG OPSITE POSTOP 4X10 (GAUZE/BANDAGES/DRESSINGS) ×1 IMPLANT
ELECT REM PT RETURN 9FT ADLT (ELECTROSURGICAL) ×1
ELECTRODE REM PT RTRN 9FT ADLT (ELECTROSURGICAL) ×1 IMPLANT
EXTRACTOR VACUUM KIWI (MISCELLANEOUS) IMPLANT
GAUZE SPONGE 4X4 12PLY STRL LF (GAUZE/BANDAGES/DRESSINGS) IMPLANT
GLOVE BIO SURGEON STRL SZ 6.5 (GLOVE) ×1 IMPLANT
GLOVE BIOGEL PI IND STRL 7.0 (GLOVE) ×1 IMPLANT
GOWN STRL REUS W/TWL LRG LVL3 (GOWN DISPOSABLE) ×2 IMPLANT
KIT ABG SYR 3ML LUER SLIP (SYRINGE) IMPLANT
NDL HYPO 25X5/8 SAFETYGLIDE (NEEDLE) IMPLANT
NEEDLE HYPO 25X5/8 SAFETYGLIDE (NEEDLE) IMPLANT
NS IRRIG 1000ML POUR BTL (IV SOLUTION) ×1 IMPLANT
PACK C SECTION WH (CUSTOM PROCEDURE TRAY) ×1 IMPLANT
PAD ABD 7.5X8 STRL (GAUZE/BANDAGES/DRESSINGS) IMPLANT
PAD OB MATERNITY 4.3X12.25 (PERSONAL CARE ITEMS) ×1 IMPLANT
RTRCTR C-SECT PINK 25CM LRG (MISCELLANEOUS) ×1 IMPLANT
STRIP CLOSURE SKIN 1/2X4 (GAUZE/BANDAGES/DRESSINGS) IMPLANT
SUT CHROMIC 1 CTX 36 (SUTURE) ×2 IMPLANT
SUT PLAIN 0 NONE (SUTURE) IMPLANT
SUT PLAIN 2 0 XLH (SUTURE) ×1 IMPLANT
SUT VIC AB 0 CT1 27 (SUTURE) ×2
SUT VIC AB 0 CT1 27XBRD ANBCTR (SUTURE) ×2 IMPLANT
SUT VIC AB 2-0 CT1 27 (SUTURE) ×1
SUT VIC AB 2-0 CT1 TAPERPNT 27 (SUTURE) ×1 IMPLANT
SUT VIC AB 3-0 CT1 27 (SUTURE)
SUT VIC AB 3-0 CT1 TAPERPNT 27 (SUTURE) IMPLANT
SUT VIC AB 3-0 SH 27 (SUTURE) ×1
SUT VIC AB 3-0 SH 27X BRD (SUTURE) IMPLANT
SUT VIC AB 4-0 KS 27 (SUTURE) ×1 IMPLANT
TOWEL OR 17X24 6PK STRL BLUE (TOWEL DISPOSABLE) ×1 IMPLANT
TRAY FOLEY W/BAG SLVR 14FR LF (SET/KITS/TRAYS/PACK) ×1 IMPLANT
WATER STERILE IRR 1000ML POUR (IV SOLUTION) ×1 IMPLANT

## 2022-06-20 NOTE — Progress Notes (Signed)
Patient ID: Gina Sullivan, female   DOB: [birth year], 36 y.o.   MRN: [id number] Pt still fairly comfortable, just tired  Afeb VSS FHR category 1  Cervix unchanged, some caput  Reviewed again given no cervical change  in now 14 hours I recommend proceeding with c-section.  Pt and husband agreeable. We reviewed the procedure and risks again, especially risk of bladder injury given poor drainage.   There is a current case going in OR and we will proceed after that is complete.  Pitocin turned off to rest the uterus.    Plan ancef, azithro, and TXA in OR. Consent signed

## 2022-06-20 NOTE — Transfer of Care (Signed)
Immediate Anesthesia Transfer of Care Note  Patient: Gina Sullivan  Procedure(s) Performed: CESAREAN SECTION  Patient Location: PACU  Anesthesia Type:Regional and Epidural  Level of Consciousness: awake, alert , oriented, and patient cooperative  Airway & Oxygen Therapy: Patient Spontanous Breathing  Post-op Assessment: Report given to RN and Post -op Vital signs reviewed and stable  Post vital signs: Reviewed and stable  Last Vitals:  Vitals Value Taken Time  BP 157/107 06/20/22 2154  Temp    Pulse 114 06/20/22 2158  Resp 19 06/20/22 2158  SpO2 98 % 06/20/22 2158  Vitals shown include unvalidated device data.  Last Pain:  Vitals:   06/20/22 1931  TempSrc:   PainSc: 0-No pain      Patients Stated Pain Goal: 10 (00/86/76 1950)  Complications: No notable events documented.

## 2022-06-20 NOTE — Progress Notes (Signed)
Patient ID: Gina Sullivan, female   DOB: [birth year], 36 y.o.   MRN: [id number] Pt overall fairly comfortable.  Having some right hip discomfort intermittently  FHR category 1 Afeb    Cervix c/7+/+1-0 still OP  D/w pt and husband that she has had minimal cervical change in 12 hours and I feel it is time to proceed with c-section.  I reviewed the c-section in detail with risks and benefits of bleeding, infection and possible damage to surrounding organs. We discussed while transfusion not likely to be needed in event of emergency we would proceed.  Husband states they would like to give things a few more hours. Since baby is doing fine, I told them that is reasonable and we will recheck in 3 hours.    UOP still poor, but bladder scanner does not show a full bladder will give a 5102m bolus.

## 2022-06-20 NOTE — Progress Notes (Signed)
Comfortable with epidural Afeb, VSS, BP 130-140/80-90 FHT-130, Cat I, ctx q 2 min on pitocin 30 mu/min EFW 8 lbs VE-7/C/+1, vtx  Now making progress in active labor, continue pitocin and monitor progress, continue PCN for +GBS.  BP borderline, just monitor for now

## 2022-06-20 NOTE — Anesthesia Postprocedure Evaluation (Signed)
Anesthesia Post Note  Patient: Gina Sullivan  Procedure(s) Performed: CESAREAN SECTION     Patient location during evaluation: PACU Anesthesia Type: Epidural Level of consciousness: awake and alert and oriented Pain management: pain level controlled Vital Signs Assessment: post-procedure vital signs reviewed and stable Respiratory status: spontaneous breathing, nonlabored ventilation and respiratory function stable Cardiovascular status: blood pressure returned to baseline and stable Postop Assessment: no headache, no backache, epidural receding and no apparent nausea or vomiting Anesthetic complications: no  No notable events documented.  Last Vitals:  Vitals:   06/20/22 2300 06/20/22 2305  BP: (!) 142/95 (!) 136/95  Pulse: (!) 102   Resp: 18   Temp:    SpO2: 97%     Last Pain:  Vitals:   06/20/22 2300  TempSrc:   PainSc: 0-No pain   Pain Goal: Patients Stated Pain Goal: 10 (06/19/22 0912)  LLE Motor Response: Non-purposeful movement (06/20/22 2300) LLE Sensation: Tingling (06/20/22 2300) RLE Motor Response: Purposeful movement (06/20/22 2300) RLE Sensation: Tingling (06/20/22 2300)     Epidural/Spinal Function Cutaneous sensation: Tingles (06/20/22 2300), Patient able to flex knees: Yes (06/20/22 2300), Patient able to lift hips off bed: No (06/20/22 2300), Back pain beyond tenderness at insertion site: No (06/20/22 2300), Progressively worsening motor and/or sensory loss: No (06/20/22 2300), Bowel and/or bladder incontinence post epidural: No (06/20/22 2300)  Pervis Hocking

## 2022-06-20 NOTE — Progress Notes (Signed)
Patient ID: Gina Sullivan, female   DOB: January 08, 1986, 36 y.o.   MRN: [id number] Pt feeling occasional tension in back, but not uncomfortable  Afeb BP a bit labile some high but not consistently FHR category 1  Cervix c/7+/+1 Still OP   UOP diminished and bloody, likely due to fetal position  Pt continues to have protracted labor course with minimal cervical change in last 8 hours.  Given OP presentation we discussed reasonable to give a bit longer if she desires vs proceeding with c-section.  FHR category 1.   Pt desires to continue laboring for a few more hours and see if things change.

## 2022-06-20 NOTE — Progress Notes (Signed)
Patient ID: Gina Sullivan, female   DOB: 1986-04-01, 36 y.o.   MRN: [id number] Pt reports feeling no pain or pressure.  Has been sleeping  BP 130/70-80 Temp 99 FHR category 1 Urine bloody   Cervix c/7/+1 to 0 Possible OP  Pt with protracted labor course IUPC placed to determine contraction adequacy and do not appear adequate on 28 mu of pitocin, will stop for 15 minutes and then restart at 12 mu to see if can improve adequacy Pt placed in left Sims  Continue to follow progress and see if baby will rotate and descend

## 2022-06-20 NOTE — Op Note (Signed)
Operative Note    Preoperative Diagnosis Term pregnancy at 39 4/7 weeks Arrest of Dilation at 7cm Direct OP presentation  Postoperative Diagnosis Same with moderate uterine atony  Procedure Primary low transverse c-section with 2 layer closure of uterus  Surgeon Paula Compton, MD  Anesthesia Epidural  Fluids: EBL 957m UOP 1282mclearing urine (was frankly bloody prior to surgery) IVF 100026mFindings A viable female infant in the vertex direct OP presentation.  Apgars 8,9.  Weight 8#5oz  Uterus with moderate atony--800m87mytotec placed intracavitary.  Normal tubes and ovaries  Specimen Placenta to L&D  Procedure Note Patient was taken to the operating room where epidural anesthesia was boosted and found to be adequate by Allis clamp test. She was prepped and draped in the normal sterile fashion in the dorsal supine position with a leftward tilt. An appropriate time out was performed. A Pfannenstiel skin incision was then made with the scalpel and carried through to the underlying layer of fascia by sharp dissection and Bovie cautery. The fascia was nicked in the midline and the incision was extended laterally with Mayo scissors. The inferior aspect of the incision was grasped Coker clamps and dissected off the underlying rectus muscles. In a similar fashion the superior aspect was dissected off the rectus muscles. Rectus muscles were separated in the midline and the peritoneal cavity entered bluntly. The peritoneal incision was then extended both superiorly and inferiorly with careful attention to avoid both bowel and bladder. The Alexis self-retaining wound retractor was then placed within the incision and the lower uterine segment exposed. The bladder flap was developed with Metzenbaum scissors and pushed away from the lower uterine segment. The lower uterine segment was then incised in a transverse fashion and the cavity itself entered bluntly. The incision was extended  bluntly. The infant's head was then lifted and delivered from the incision without difficulty. The remainder of the infant delivered and the nose and mouth bulb suctioned with the cord clamped and cut as wellafter delayed cord clamping x 1 minute.  The infant was handed off to the waiting pediatricians. The placenta was then spontaneously expressed from the uterus and the uterus cleared of all clots and debris with moist lap sponge. There was moderate atony so pitocin given, bimanual massage performed and 800mc26m cytotec was placed in the cavity with improvement. The uterine incision was then repaired in 2 layers the first layer was a running locked layer 1-0 chromic and the second an imbricating layer of the same suture. The tubes and ovaries were inspected and the gutters cleared of all clots and debris.One small area of bleeding at the lower left uterine incision was controlled with figure of eight suture of 3-0 vicryl.  The uterine incision was then inspected and found to be hemostatic. All instruments and sponges as well as the Alexis retractor were then removed from the abdomen. The rectus muscles and peritoneum were then reapproximated with a running suture of 2-0 Vicryl. The fascia was then closed with 0 Vicryl in a running fashion. Subcutaneous tissue was reapproximated with 3-0 plain in a running fashion. The skin was closed with a subcuticular stitch of 4-0 Vicryl on a Keith needle and then reinforced with benzoin and Steri-Strips. At the conclusion of the procedure all instruments and sponge counts were correct. Patient was taken to the recovery room in good condition with her baby accompanying her skin to skin.

## 2022-06-21 ENCOUNTER — Encounter (HOSPITAL_COMMUNITY): Payer: Self-pay | Admitting: Obstetrics and Gynecology

## 2022-06-21 LAB — CBC
HCT: 34.8 % — ABNORMAL LOW (ref 36.0–46.0)
Hemoglobin: 11.8 g/dL — ABNORMAL LOW (ref 12.0–15.0)
MCH: 31.1 pg (ref 26.0–34.0)
MCHC: 33.9 g/dL (ref 30.0–36.0)
MCV: 91.8 fL (ref 80.0–100.0)
Platelets: 190 10*3/uL (ref 150–400)
RBC: 3.79 MIL/uL — ABNORMAL LOW (ref 3.87–5.11)
RDW: 13.1 % (ref 11.5–15.5)
WBC: 17.4 10*3/uL — ABNORMAL HIGH (ref 4.0–10.5)
nRBC: 0 % (ref 0.0–0.2)

## 2022-06-21 MED ORDER — PHENYLEPHRINE 80 MCG/ML (10ML) SYRINGE FOR IV PUSH (FOR BLOOD PRESSURE SUPPORT)
PREFILLED_SYRINGE | INTRAVENOUS | Status: AC
  Filled 2022-06-21: qty 10

## 2022-06-21 MED ORDER — PHENYLEPHRINE HCL-NACL 20-0.9 MG/250ML-% IV SOLN
INTRAVENOUS | Status: AC
  Filled 2022-06-21: qty 250

## 2022-06-21 MED ORDER — DEXMEDETOMIDINE HCL IN NACL 80 MCG/20ML IV SOLN
INTRAVENOUS | Status: AC
  Filled 2022-06-21: qty 20

## 2022-06-21 MED ORDER — LEVOTHYROXINE SODIUM 75 MCG PO TABS
175.0000 ug | ORAL_TABLET | Freq: Every day | ORAL | Status: DC
  Administered 2022-06-21 – 2022-06-22 (×2): 175 ug via ORAL
  Filled 2022-06-21 (×2): qty 1

## 2022-06-21 MED ORDER — LIDOCAINE-EPINEPHRINE (PF) 2 %-1:200000 IJ SOLN
INTRAMUSCULAR | Status: AC
  Filled 2022-06-21: qty 20

## 2022-06-21 MED ORDER — ENOXAPARIN SODIUM 60 MG/0.6ML IJ SOSY
50.0000 mg | PREFILLED_SYRINGE | INTRAMUSCULAR | Status: DC
  Administered 2022-06-21 – 2022-06-22 (×2): 50 mg via SUBCUTANEOUS
  Filled 2022-06-21 (×2): qty 0.6

## 2022-06-21 MED ORDER — DIPHENHYDRAMINE HCL 50 MG/ML IJ SOLN
INTRAMUSCULAR | Status: AC
  Filled 2022-06-21: qty 1

## 2022-06-21 MED ORDER — OXYTOCIN-SODIUM CHLORIDE 30-0.9 UT/500ML-% IV SOLN
INTRAVENOUS | Status: AC
  Filled 2022-06-21: qty 1000

## 2022-06-21 MED ORDER — EPHEDRINE 5 MG/ML INJ
INTRAVENOUS | Status: AC
  Filled 2022-06-21: qty 5

## 2022-06-21 MED ORDER — LACTATED RINGERS IV BOLUS
500.0000 mL | Freq: Once | INTRAVENOUS | Status: AC
  Administered 2022-06-21: 500 mL via INTRAVENOUS

## 2022-06-21 NOTE — Lactation Note (Signed)
This note was copied from Gina baby's chart. Lactation Consultation Note  Patient Name: Gina Sullivan Today's Date: 06/21/2022 Reason for consult: Follow-up assessment;Primapara;1st time breastfeeding;Term Age:34 hours  LC in to room for follow up. Birthing parent is trying to latch upon LC's arrival. Reviewed hand expression and drops easily observed. Infant latches with ease, reviewed positioning, alignment, breast compression and deep latch. Demonstrated hand pump use, including frequency/use/care and milk storage. LC reinforced pacing when bottlefeeding, frequent burping and upright position as well as following appropriate volume per age with and without breastfeeding.   Reviewed normal newborn behavior during first 24h, expected output, tummy size and feeding frequency.   Plan:   1-Feeding on demand.  2-Pump as needed for stimulation/supplementation 3-If formula feeding, volume according to age, pace bottled feeding, frequent burping and upright position. 4-Encouraged birthing parent hydration, nutrition and rest.  Contact LC as needed for feeds/support/concerns/questions. All questions answered at this time. Local resources discussed.   Maternal Data Has patient been taught Hand Expression?: Yes Does the patient have breastfeeding experience prior to this delivery?: No  Feeding Mother's Current Feeding Choice: Breast Milk and Formula  LATCH Score Latch: Grasps breast easily, tongue down, lips flanged, rhythmical sucking.  Audible Swallowing: Spontaneous and intermittent  Type of Nipple: Everted at rest and after stimulation (small, short shafted nipples)  Comfort (Breast/Nipple): Soft / non-tender  Hold (Positioning): Assistance needed to correctly position infant at breast and maintain latch.  LATCH Score: 9   Lactation Tools Discussed/Used Tools: Pump;Flanges Flange Size: 21 Breast pump type: Manual Pump Education: Setup, frequency, and cleaning;Milk  Storage Reason for Pumping: stimulation and supplementation Pumping frequency: as needed Pumped volume:  (drops)  Interventions Interventions: Breast feeding basics reviewed;Assisted with latch;Skin to skin;Breast massage;Hand express;Breast compression;Hand pump;Expressed milk;Support pillows;Adjust position;Education;Pace feeding  Discharge Discharge Education: Engorgement and breast care;Warning signs for feeding baby Pump: Manual;Personal WIC Program: No  Consult Status Consult Status: Follow-up Date: 06/22/22 Follow-up type: In-patient    Gina Sullivan Gina Sullivan 06/21/2022, 2:09 PM

## 2022-06-21 NOTE — Progress Notes (Signed)
Subjective: Postpartum Day 1: Cesarean Delivery Patient reports pain controlled.  No nausea, but has not eaten much yet. Foley in place and urine much improved   No HA or PIH sx  Objective: Vital signs in last 24 hours: Temp:  [97.6 F (36.4 C)-99.3 F (37.4 C)] 98.2 F (36.8 C) (11/24 0617) Pulse Rate:  [78-108] 83 (11/24 0617) Resp:  [13-18] 16 (11/24 0617) BP: (98-154)/(53-107) 129/80 (11/24 0617) SpO2:  [90 %-99 %] 98 % (11/24 0617)  Physical Exam:  General: alert and cooperative Lochia: appropriate Uterine Fundus: firm Incision: C/D/I DVT Evaluation: No evidence of DVT seen on physical exam.  Recent Labs    06/19/22 0322 06/21/22 0452  HGB 14.4 11.8*  HCT 42.6 34.8*    Assessment/Plan: Status post Cesarean section. Doing well postoperatively.  BP elevated last PM, but more normal this AM--will follow and start meds if consistent Urine improved from frank bloody prior to c-section, will give bolus x 1 and when totally clears, d/c foley  Logan Bores, MD 06/21/2022, 9:17 AM

## 2022-06-21 NOTE — Progress Notes (Signed)
Dr. Marvel Plan notified of patients BP 130/94 and 1 hour later 133/90. MD gave verbal to watch overnight and to call back for severe range BP of greater than or equal to 160/100.

## 2022-06-21 NOTE — Lactation Note (Signed)
This note was copied from a baby's chart. Lactation Consultation Note  Patient Name: Gina Sullivan Today's Date: 06/21/2022 Reason for consult: Initial assessment;Primapara;Term Age:34 hours Mom was waking up wanting to feed baby. LC offered to assist in latching. Mom politely declined. Mom stated she BF her the last feeding that is going to give formula this feeding. LC suggested to let [facility] assist in latching since I was there. Mom declined. Asked mom to call for latch assistance when needed.  Mom encouraged to feed baby 8-12 times/24 hours and with feeding cues. Encouraged STS during BF and offer breast before giving formula. Mom stated she will probably alternate BF then next time formula feed.  Mom states she is very tired right now.  Maternal Data Has patient been taught Hand Expression?: No Does the patient have breastfeeding experience prior to this delivery?: No  Feeding Nipple Type: Slow - flow  LATCH Score                    Lactation Tools Discussed/Used    Interventions    Discharge    Consult Status Consult Status: Follow-up Date: 06/21/22 Follow-up type: In-patient    Theodoro Kalata 06/21/2022, 4:39 AM

## 2022-06-21 NOTE — Lactation Note (Signed)
This note was copied from a baby's chart. Lactation Consultation Note  Patient Name: Gina Sullivan Today's Date: 06/21/2022   Age:34 hours Attempted to see mom but she has been resting. Asked RN to call when mom wakes up. Maternal Data    Feeding Nipple Type: Slow - flow  LATCH Score                    Lactation Tools Discussed/Used    Interventions    Discharge    Consult Status      Theodoro Kalata 06/21/2022, 3:19 AM

## 2022-06-22 NOTE — Discharge Summary (Signed)
Postpartum Discharge Summary     Patient Name: Gina Sullivan DOB: [birth year] MRN: [id number]  Date of admission: 06/19/2022 Delivery date:06/20/2022  Delivering provider: Paula Compton  Date of discharge: 06/22/2022  Admitting diagnosis: Normal labor [O80, Z37.9] S/P primary low transverse C-section [Z98.891] Intrauterine pregnancy: [redacted]w[redacted]d    Secondary diagnosis:  Principal Problem:   Normal labor Active Problems:   S/P primary low transverse C-section  Additional problems: arrest of dilation, OP presentation, uterine atony with administration of cytotec, IVF, history of thyroid cancer    Discharge diagnosis: Preterm Pregnancy Delivered                                              Post partum procedures: none Augmentation: Pitocin Complications: REPP>29hours  Hospital course: Onset of Labor With Unplanned C/S   36y.o. yo G2P1011 at 356w4das admitted in Latent Labor on 06/19/2022. Patient had a labor course significant for protraction. The patient went for cesarean section due to  arrest of dilation . Delivery details as follows: Membrane Rupture Time/Date: 12:30 AM ,06/19/2022   Delivery Method:C-Section, Low Transverse  Details of operation can be found in separate operative note. Patient had a postpartum course complicated by nothing.  She is ambulating,tolerating a regular diet, passing flatus, and urinating well.  Patient is discharged home in stable condition 06/22/22.  Newborn Data: Birth date:06/20/2022  Birth time:8:51 PM  Gender:Female  Living status:Living  Apgars:8 ,9  Weight:3780 g   Physical exam  Vitals:   06/21/22 1330 06/21/22 1545 06/21/22 2321 06/22/22 0524  BP:  121/75 115/82 119/81  Pulse:  94 96   Resp:  '20 16 17  '$ Temp:  97.9 F (36.6 C) 97.6 F (36.4 C) 97.6 F (36.4 C)  TempSrc:  Oral Oral Oral  SpO2: 98% 97%    Weight:      Height:       General: alert, cooperative, and no distress Lochia: appropriate Uterine  Fundus: firm Incision: Healing well with no significant drainage, No significant erythema, Dressing is clean, dry, and intact DVT Evaluation: No evidence of DVT seen on physical exam. Labs: Lab Results  Component Value Date   WBC 17.4 (H) 06/21/2022   HGB 11.8 (L) 06/21/2022   HCT 34.8 (L) 06/21/2022   MCV 91.8 06/21/2022   PLT 190 06/21/2022      Latest Ref Rng & Units 06/19/2022    3:22 AM  CMP  Glucose 70 - 99 mg/dL 96   BUN 6 - 20 mg/dL 9   Creatinine 0.44 - 1.00 mg/dL 0.73   Sodium 135 - 145 mmol/L 138   Potassium 3.5 - 5.1 mmol/L 4.2   Chloride 98 - 111 mmol/L 102   CO2 22 - 32 mmol/L 19   Calcium 8.9 - 10.3 mg/dL 9.5   Total Protein 6.5 - 8.1 g/dL 6.2   Total Bilirubin 0.3 - 1.2 mg/dL 0.3   Alkaline Phos 38 - 126 U/L 129   AST 15 - 41 U/L 22   ALT 0 - 44 U/L 15    Edinburgh Score:    06/21/2022    8:32 AM  Edinburgh Postnatal Depression Scale Screening Tool  I have been able to laugh and see the funny side of things. 0  I have looked forward with enjoyment to things. 0  I have blamed myself  unnecessarily when things went wrong. 1  I have been anxious or worried for no good reason. 0  I have felt scared or panicky for no good reason. 0  Things have been getting on top of me. 1  I have been so unhappy that I have had difficulty sleeping. 0  I have felt sad or miserable. 0  I have been so unhappy that I have been crying. 0  The thought of harming myself has occurred to me. 0  Edinburgh Postnatal Depression Scale Total 2      After visit meds:  Allergies as of 06/22/2022       Reactions   Niacin And Related Hives        Medication List     STOP taking these medications    Aspirin Low Dose 81 MG tablet Generic drug: aspirin EC   B-D 3CC LUER-LOK SYR 18GX1-1/2 18G X 1-1/2" 3 ML Misc Generic drug: SYRINGE-NEEDLE (DISP) 3 ML   estradiol 0.1 MG/24HR patch Commonly known as: VIVELLE-DOT   estradiol 2 MG tablet Commonly known as: ESTRACE    NEEDLE (DISP) 21 G 21G X 1-1/2" Misc   progesterone 50 MG/ML injection       TAKE these medications    levothyroxine 175 MCG tablet Commonly known as: SYNTHROID Take 1 tablet by mouth in the morning on an emtpy stomach monday thru saturday   prenatal multivitamin Tabs tablet Take 1 tablet by mouth daily.               Discharge Care Instructions  (From admission, onward)           Start     Ordered   06/22/22 0000  Discharge wound care:       Comments: Can remove dressing and steri strips 5 days from date of surgery   06/22/22 1009             Discharge home in stable condition  Infant Disposition:home with mother Discharge instruction: per After Visit Summary and Postpartum booklet. Activity: Advance as tolerated. Pelvic rest for 6 weeks.  Diet: routine diet Anticipated Birth Control: Unsure Postpartum Appointment:4 weeks Additional Postpartum F/U: Postpartum Depression checkup Future Appointments: Future Appointments  Date Time Provider [place]  08/29/2022  9:00 AM Princess Bruins, MD [facility] None   Follow up Visit:  Follow-up Information     Ob/Gyn, [facility]. Call in 4 week(s).   Contact information: [address] [place] [address] [phone]                     06/22/2022 Allyn Kenner, DO

## 2022-06-22 NOTE — Lactation Note (Signed)
This note was copied from a baby's chart. Lactation Consultation Note  Patient Name: Gina Sullivan Today's Date: 06/22/2022 Reason for consult: Follow-up assessment;Mother's request;Difficult latch;Infant weight loss;Nipple pain/trauma;Breastfeeding assistance (5.58% WL) Age:34 hours  LC entered the room per the birth parent's request.  The birth parent stated that the infant has not been wanting to feed at the breast or on the bottle.  LC assessed the birth parent's breast tissue.  The birth parent had compression stripes in the middle of both nipples. LC assisted the birth parent with latching the infant to the right breast.  LC sandwiched the breast tissue, adjusted the infant's position, and used support pillows.  The infant latched with her tongue down, lips were flanged, sucking was rhythmic, and some swallows were noted. Per the birth parent the feeding was a bit painful, but it got better throughout the feeding.  The infant fed for 7 min and we attempted to switch sides, but the infant was not interested.  LC assisted the birth parent with feeding the infant formula.  The infant was gulping and milk was spilling out of her mouth with the gold nipple. LC changed the nipple to the purple extra slow flow nipple and the infant took 59m.  [facility]  LC spoke with the parents about feeding cues and pace bottle feeding.  All questions were answered.   Infant Feeding Plan:  Breastfeed according to feeding cues 8+ times in 24 hours.  Put the infant to the breast prior to supplementing with formula.  Use the purple slow flow nipple to feed the infant.  Refer to supplementation guidelines for clarification on how many mL to supplement the infant with.  Call [facility] assistance with breastfeeding.  Maternal Data Does the patient have breastfeeding experience prior to this delivery?: No  Feeding Nipple Type: Nfant Slow Flow (purple)  LATCH  Score Latch: Grasps breast easily, tongue down, lips flanged, rhythmical sucking.  Audible Swallowing: A few with stimulation  Type of Nipple: Everted at rest and after stimulation  Comfort (Breast/Nipple): Filling, red/small blisters or bruises, mild/mod discomfort  Hold (Positioning): Assistance needed to correctly position infant at breast and maintain latch.  LATCH Score: 7   Lactation Tools Discussed/Used    Interventions Interventions: Breast feeding basics reviewed;Assisted with latch;Adjust position;Support pillows;Education  Discharge    Consult Status Consult Status: Follow-up Date: 06/23/22 Follow-up type: In-patient    SLysbeth Penner11/25/2023, 9:40 AM

## 2022-06-22 NOTE — Lactation Note (Signed)
This note was copied from a baby's chart. Lactation Consultation Note  Patient Name: Gina Sullivan Today's Date: 06/22/2022   Age:34 hours  LC attempted to visit with the dyad, but the birth parent was in the shower. Lactation will follow up later.   [place]ee 06/22/2022, 1:42 PM

## 2022-06-24 ENCOUNTER — Other Ambulatory Visit (HOSPITAL_COMMUNITY): Payer: Self-pay

## 2022-06-24 MED ORDER — OXYCODONE HCL 5 MG PO TABS
5.0000 mg | ORAL_TABLET | Freq: Four times a day (QID) | ORAL | 0 refills | Status: DC | PRN
  Filled 2022-06-24: qty 16, 4d supply, fill #0

## 2022-06-27 ENCOUNTER — Inpatient Hospital Stay (HOSPITAL_COMMUNITY): Payer: 59

## 2022-06-27 ENCOUNTER — Telehealth (HOSPITAL_COMMUNITY): Payer: Self-pay

## 2022-06-27 ENCOUNTER — Inpatient Hospital Stay (HOSPITAL_COMMUNITY): Admission: RE | Admit: 2022-06-27 | Payer: 59 | Source: Home / Self Care | Admitting: Obstetrics and Gynecology

## 2022-06-27 NOTE — Telephone Encounter (Signed)
Patient reports feeling good. "My OB gave me something for pain and it is helping. My incision is doing ok." Patient declines questions/concerns about her health and healing.  Patient reports that baby is doing well. "We have been going to the pediatrician appointments and everything is ok." Baby sleeps in a bassinet. RN reviewed ABC's of safe sleep with patient. Patient declines any questions or concerns about baby.  EPDS score is 1.  Sharyn Lull [facility]  06/27/22,1525

## 2022-08-29 ENCOUNTER — Ambulatory Visit: Payer: 59 | Admitting: Obstetrics & Gynecology

## 2022-10-03 ENCOUNTER — Ambulatory Visit (INDEPENDENT_AMBULATORY_CARE_PROVIDER_SITE_OTHER): Payer: 59 | Admitting: Obstetrics & Gynecology

## 2022-10-03 ENCOUNTER — Other Ambulatory Visit (HOSPITAL_COMMUNITY)
Admission: RE | Admit: 2022-10-03 | Discharge: 2022-10-03 | Disposition: A | Discharge status: Home / Self Care | Payer: 59 | Source: Ambulatory Visit | Attending: Obstetrics & Gynecology | Admitting: Obstetrics & Gynecology

## 2022-10-03 ENCOUNTER — Encounter: Payer: Self-pay | Admitting: Obstetrics & Gynecology

## 2022-10-03 VITALS — BP 112/76 | HR 104 | Ht 63.75 in | Wt 194.0 lb

## 2022-10-03 DIAGNOSIS — Z113 Encounter for screening for infections with a predominantly sexual mode of transmission: Secondary | ICD-10-CM | POA: Diagnosis present

## 2022-10-03 DIAGNOSIS — N941 Unspecified dyspareunia: Secondary | ICD-10-CM | POA: Diagnosis not present

## 2022-10-03 DIAGNOSIS — Z3009 Encounter for other general counseling and advice on contraception: Secondary | ICD-10-CM

## 2022-10-03 DIAGNOSIS — Z01419 Encounter for gynecological examination (general) (routine) without abnormal findings: Secondary | ICD-10-CM | POA: Diagnosis present

## 2022-10-03 DIAGNOSIS — C73 Malignant neoplasm of thyroid gland: Secondary | ICD-10-CM

## 2022-10-03 NOTE — Progress Notes (Signed)
Gina Sullivan 05/14/1986 [id number]   History:    37 y.o. G2P1A1L1 Married.  IVF pregnancy, daughter is 21 month old.   RP:  Established patient presenting for annual gyn exam    HPI: Postpartum 3 months.  LMP normal 09/23/22.  Declines contraception, IVF pregnancy. No pelvic pain, except for lower pelvic cramping during intercourse. Pap 06/2021 Neg.  Pap/HPV HR, Gono-Chlam today.  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  Breasts normal.  Had a Lt screening Mammo/Rt Dx mammo/US Benign, stable in 03/2021.  BMI 33.56.  Not very physically active.  Health labs with Fam MD.  Past medical history,surgical history, family history and social history were all reviewed and documented in the EPIC chart.  Gynecologic History Patient's last menstrual period was 09/23/2022 (exact date).  Obstetric History OB History  Gravida Para Term Preterm AB Living  '2 1 1   1 1  '$ SAB IAB Ectopic Multiple Live Births  1     0 1    # Outcome Date GA Lbr Len/2nd Weight Sex Delivery Anes PTL Lv  2 Term 06/20/22 [redacted]w[redacted]d 8 lb 5.3 oz (3.78 kg) F CS-LTranv EPI  LIV     Birth Comments: none  1 SAB              ROS: A ROS was performed and pertinent positives and negatives are included in the history. GENERAL: No fevers or chills. HEENT: No change in vision, no earache, sore throat or sinus congestion. NECK: No pain or stiffness. CARDIOVASCULAR: No chest pain or pressure. No palpitations. PULMONARY: No shortness of breath, cough or wheeze. GASTROINTESTINAL: No abdominal pain, nausea, vomiting or diarrhea, melena or bright red blood per rectum. GENITOURINARY: No urinary frequency, urgency, hesitancy or dysuria. MUSCULOSKELETAL: No joint or muscle pain, no back pain, no recent trauma. DERMATOLOGIC: No rash, no itching, no lesions. ENDOCRINE: No polyuria, polydipsia, no heat or cold intolerance. No recent  change in weight. HEMATOLOGICAL: No anemia or easy bruising or bleeding. NEUROLOGIC: No headache, seizures, numbness, tingling or weakness. PSYCHIATRIC: No depression, no loss of interest in normal activity or change in sleep pattern.     Exam:   BP 112/76   Pulse (!) 104   Ht 5' 3.75" (1.619 m)   Wt 194 lb (88 kg)   LMP 09/23/2022 (Exact Date) Comment: sexually active-no contraception  SpO2 99%   BMI 33.56 kg/m   Body mass index is 33.56 kg/m.  General appearance : Well developed well nourished female. No acute distress HEENT: Eyes: no retinal hemorrhage or exudates,  Neck supple, trachea midline, no carotid bruits, no thyroidmegaly Lungs: Clear to auscultation, no rhonchi or wheezes, or rib retractions  Heart: Regular rate and rhythm, no murmurs or gallops Breast:Examined in sitting and supine position were symmetrical in appearance, no palpable masses or tenderness,  no skin retraction, no nipple inversion, no nipple discharge, no skin discoloration, no axillary or supraclavicular lymphadenopathy Abdomen: no palpable masses or tenderness, no rebound or guarding Extremities: no edema or skin discoloration or tenderness  Pelvic: Vulva: Normal             Vagina: No gross lesions or discharge  Cervix: No gross lesions or discharge.  Pap/HPV HR, Gono-Chlam done.  Uterus  AV, normal size, shape and consistency, non-tender and mobile  Adnexa  Without masses or tenderness  Anus: Normal   Assessment/Plan:  37  y.o. female for annual exam   1. Encounter for routine gynecological examination with Papanicolaou smear of cervix Postpartum 3 months.  LMP normal 09/23/22.  Declines contraception, IVF pregnancy. No pelvic pain, except for lower pelvic cramping during intercourse. Pap 06/2021 Neg.  Pap/HPV HR, Gono-Chlam today.  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  Breasts normal.   Had a Lt screening Mammo/Rt Dx mammo/US Benign, stable in 03/2021.  BMI 33.56.  Not very physically active.  Health labs with Fam MD. - Cytology - PAP( [place])  2. Encounter for other general counseling or advice on contraception IVF pregnancy, declines contraception.  3. Screen for STD (sexually transmitted disease) Gono-Chlam-HPV HR on Pap. - Cytology - PAP( [place])  4. Dyspareunia in female Normal gyn exam today.  STI screen done.  3 months PP, will observe with change in position/Ibuprofen up to a year.  If worsens, recommend attempting a Progestin BCP and completing the investigation with a Pelvic US.  5. Thyroid cancer ([place]) Post Thyroidectomy.  Other orders - levothyroxine (SYNTHROID) 150 MCG tablet; Take 150 mcg by mouth daily. - Cholecalciferol (VITAMIN D3 PO); Take by mouth.   Princess Bruins MD, 2:14 PM

## 2022-10-07 ENCOUNTER — Other Ambulatory Visit (HOSPITAL_COMMUNITY): Payer: Self-pay

## 2022-10-07 LAB — CYTOLOGY - PAP
Chlamydia: NEGATIVE
Comment: NEGATIVE
Comment: NEGATIVE
Comment: NORMAL
Diagnosis: NEGATIVE
High risk HPV: NEGATIVE
Neisseria Gonorrhea: NEGATIVE

## 2022-10-07 MED ORDER — BENZONATATE 200 MG PO CAPS
200.0000 mg | ORAL_CAPSULE | Freq: Three times a day (TID) | ORAL | 0 refills | Status: DC
  Filled 2022-10-07: qty 15, 5d supply, fill #0

## 2022-10-07 MED ORDER — OMEPRAZOLE 20 MG PO CPDR
20.0000 mg | DELAYED_RELEASE_CAPSULE | Freq: Every morning | ORAL | 1 refills | Status: DC
  Filled 2022-10-07: qty 30, 30d supply, fill #0

## 2022-10-07 MED ORDER — ALPRAZOLAM 0.25 MG PO TABS
0.2500 mg | ORAL_TABLET | Freq: Every day | ORAL | 0 refills | Status: DC
  Filled 2022-10-07: qty 30, 30d supply, fill #0

## 2022-10-18 ENCOUNTER — Other Ambulatory Visit (HOSPITAL_COMMUNITY): Payer: Self-pay

## 2022-10-18 ENCOUNTER — Telehealth: Payer: Self-pay

## 2022-10-18 DIAGNOSIS — Z30011 Encounter for initial prescription of contraceptive pills: Secondary | ICD-10-CM

## 2022-10-18 MED ORDER — NORETHINDRONE 0.35 MG PO TABS
1.0000 | ORAL_TABLET | Freq: Every day | ORAL | 3 refills | Status: DC
  Filled 2022-10-18: qty 84, 84d supply, fill #0
  Filled 2022-12-30: qty 84, 84d supply, fill #1

## 2022-10-18 NOTE — Telephone Encounter (Signed)
Per ML: "Can send a prescription of Progestin only BCPs."   Pt notified and voiced understanding.  Rx sent. Encounter closed.

## 2022-10-18 NOTE — Telephone Encounter (Signed)
Pt calling to report LMP: 10/17/2022 and she is ready to start POPs recommended at [facility] on 10/03/2022.   Please advise.

## 2022-11-04 ENCOUNTER — Other Ambulatory Visit (HOSPITAL_COMMUNITY): Payer: Self-pay

## 2022-11-04 MED ORDER — MELOXICAM 15 MG PO TABS
15.0000 mg | ORAL_TABLET | Freq: Every day | ORAL | 1 refills | Status: DC
  Filled 2022-11-04: qty 30, 30d supply, fill #0

## 2022-11-13 ENCOUNTER — Ambulatory Visit: Payer: 59 | Admitting: Internal Medicine

## 2022-11-22 ENCOUNTER — Encounter: Payer: Self-pay | Admitting: Internal Medicine

## 2022-11-22 ENCOUNTER — Ambulatory Visit (INDEPENDENT_AMBULATORY_CARE_PROVIDER_SITE_OTHER): Payer: 59 | Admitting: Internal Medicine

## 2022-11-22 ENCOUNTER — Other Ambulatory Visit: Payer: Self-pay

## 2022-11-22 VITALS — BP 114/68 | HR 87 | Temp 98.3°F | Ht 64.0 in | Wt 192.8 lb

## 2022-11-22 DIAGNOSIS — Z8709 Personal history of other diseases of the respiratory system: Secondary | ICD-10-CM

## 2022-11-22 DIAGNOSIS — J3089 Other allergic rhinitis: Secondary | ICD-10-CM

## 2022-11-22 DIAGNOSIS — T7840XA Allergy, unspecified, initial encounter: Secondary | ICD-10-CM | POA: Diagnosis not present

## 2022-11-22 MED ORDER — FLUTICASONE PROPIONATE 50 MCG/ACT NA SUSP: 2.0000 | Freq: Every day | NASAL | 5 refills | Status: DC

## 2022-11-22 NOTE — Patient Instructions (Addendum)
Chronic Rhinitis: - Will plan to do skin testing - Use nasal saline rinses before nose sprays such as with Neilmed Sinus Rinse.  Use distilled water.   - Hold all anti histamines (benadryl, zyrtec, claritin, allegra) until next visit.  - Use Flonase 2 sprays each nostril daily. Aim upward and outward.    History of Allergic Reaction - Unknown trigger. For now we will watch this. - If it reoccurs, please write down what you ate.   History of Asthma - Likely outgrew it. No albuterol use or symptoms in years.  Follow up: 8:30 for skin testing with me on 11/26/2022 [facility] to [facility]

## 2022-11-22 NOTE — Progress Notes (Signed)
NEW PATIENT  Date of Service/Encounter:  11/22/22  Consult requested by: Pcp, No   Subjective:   Gina Sullivan (DOB: 1986-06-09) is a 37 y.o. female who presents to the clinic on 11/22/2022 with a chief complaint of Establish Care and Allergic Reaction .    History obtained from: chart review and patient.   Allergic Reaction: Recalls going to a [place] and eating a chicken burrito with a vegetable sauce. Not sure what was in the sauce exactly. On the way home, she felt some throat itching and then had loss of voice/cough.  This persisted for about 2 days especially with a sore throat and loss of voice and then she took an allergy pill and then day after, it was better.  Initially thought it was an illness.  Denies any hives, rash, vomiting, diarrhea.  Never has had any food allergies.   Asthma:  Prior history of asthma but has not had any issues with it in the recent years. Does not use an albuterol inhaler.   Rhinitis:  Started in childhood. Symptoms include: nasal congestion, rhinorrhea, post nasal drainage, sneezing, watery eyes, and itchy eyes  Occurs seasonally-Spring/Summer Potential triggers: pollen  Treatments tried:  Benadryl PRN; last use was yesterday  No nose sprays  Previous allergy testing: no History of reflux/heartburn: previously was on Omeprazole but no symptoms recently so has been off of it. 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)    Past Surgical History: Past Surgical History:  Procedure Laterality Date   CESAREAN SECTION N/A 06/20/2022   Procedure: CESAREAN SECTION;  Surgeon: Huel Cote, MD;  Location: [facility] LD ORS;  Service: Obstetrics;  Laterality: N/A;   THYROIDECTOMY  01/17/2017   THYROIDECTOMY Right  01/17/2017   Procedure: THYROIDECTOMY;  Surgeon: Christia Reading, MD;  Location: [facility] OR;  Service: ENT;  Laterality: Right;  total thyroidectomy   THYROIDECTOMY      Family History: Family History  Problem Relation Age of Onset   Hypertension Mother    Hypertension Father     Social History:  Lives in a unknown year house Flooring in bedroom: carpet Pets: dog Tobacco use/exposure: none Job: optician  Medication List:  Allergies as of 11/22/2022       Reactions   Niacin And Related Hives        Medication List        Accurate as of November 22, 2022  4:14 PM. If you have any questions, ask your nurse or doctor.          STOP taking these medications    benzonatate 200 MG capsule Commonly known as: TESSALON Stopped by: Birder Robson, MD       TAKE these medications    ALPRAZolam 0.25 MG tablet Commonly known as: Xanax Take 1 tablet (0.25 mg total) by mouth daily as needed   fluticasone 50 MCG/ACT nasal spray Commonly known as: FLONASE Place 2 sprays into both nostrils daily. Started by: Birder Robson, MD   Janyce Llanos 0.35 MG tablet Generic drug: norethindrone Take 1 tablet (0.35 mg total) by mouth daily.   levothyroxine 150 MCG tablet Commonly known as: SYNTHROID Take 150 mcg by mouth daily.   meloxicam 15 MG tablet Commonly known as: MOBIC  Take 1 tablet (15 mg total) by mouth daily.   omeprazole 20 MG capsule Commonly known as: PRILOSEC Take 1 capsule (20 mg total) by mouth every morning 30 minutes before morning meal as needed.   prenatal multivitamin Tabs tablet Take 1 tablet by mouth daily.   VITAMIN D3 PO Take by mouth.         REVIEW OF SYSTEMS: Pertinent positives and negatives discussed in HPI.   Objective:   Physical Exam: BP 114/68   Pulse 87   Temp 98.3 F (36.8 C)   Ht 5\' 4"  (1.626 m)   Wt 192 lb 12.8 oz (87.5 kg)   SpO2 96%   BMI 33.09 kg/m  Body mass index is 33.09 kg/m. GEN: alert, well developed HEENT: clear  conjunctiva, TM grey and translucent, nose with + inferior turbinate hypertrophy, pink nasal mucosa, slight clear rhinorrhea, no cobblestoning HEART: regular rate and rhythm, no murmur LUNGS: clear to auscultation bilaterally, no coughing, unlabored respiration ABDOMEN: soft, non distended  SKIN: no rashes or lesions  Reviewed:  10/03/2022: seen by obgyn for routine annual visit, has hx of hypothyroidism on Synthroid and previously with thyroid cancer s/p thyroidectomy.   10/30/2022: seen by [facility] for a possible food reaction after eating at a restaurant.  Also had a cough and stomach bug recently. Referred to Allergy for further evaluation. Started on tessalon perles.    Assessment:   1. Other allergic rhinitis   2. Allergic reaction, initial encounter   3. History of asthma     Plan/Recommendations:  Chronic Rhinitis: - Will plan to do skin testing at next visit to identify aeroallergen triggers due to seasonal symptoms, turbinate hypertrophy and unresponsive to OTC meds.  - Use nasal saline rinses before nose sprays such as with Neilmed Sinus Rinse.  Use distilled water.   - Hold all anti histamines (benadryl, zyrtec, claritin, allegra) until next visit.  - Use Flonase 2 sprays each nostril daily. Aim upward and outward.    History of Allergic Reaction? - Initially with throat itching, cough and loss of voice after eating at a [place]; loss of voice and sore throat then for 2 days after. No other symptoms.  - Unknown trigger. For now we will watch this. I am not sure if this was truly an allergic reaction or not as her symptoms persisted for 2 days.  - If it reoccurs, please write down what you ate and keep a diary.   History of Asthma - Likely outgrew it. No albuterol use or symptoms in years.  Return in about 4 days (around 11/26/2022).  Alesia Morin, MD [facility]

## 2022-11-26 ENCOUNTER — Other Ambulatory Visit: Payer: Self-pay

## 2022-11-26 ENCOUNTER — Ambulatory Visit (INDEPENDENT_AMBULATORY_CARE_PROVIDER_SITE_OTHER): Payer: 59 | Admitting: Internal Medicine

## 2022-11-26 ENCOUNTER — Encounter: Payer: Self-pay | Admitting: Internal Medicine

## 2022-11-26 VITALS — BP 110/70 | HR 88 | Temp 98.2°F | Ht 64.0 in | Wt 192.1 lb

## 2022-11-26 DIAGNOSIS — J3089 Other allergic rhinitis: Secondary | ICD-10-CM

## 2022-11-26 DIAGNOSIS — J302 Other seasonal allergic rhinitis: Secondary | ICD-10-CM | POA: Diagnosis not present

## 2022-11-26 MED ORDER — FLUTICASONE PROPIONATE 50 MCG/ACT NA SUSP: 2.0000 | Freq: Every day | NASAL | 5 refills | Status: DC

## 2022-11-26 MED ORDER — AZELASTINE HCL 0.1 % NA SOLN: 1.0000 | Freq: Two times a day (BID) | NASAL | 5 refills | Status: DC | PRN

## 2022-11-26 MED ORDER — CETIRIZINE HCL 10 MG PO TABS: 10.0000 mg | ORAL_TABLET | Freq: Every day | ORAL | 5 refills | Status: DC

## 2022-11-26 MED ORDER — OLOPATADINE HCL 0.2 % OP SOLN: 1.0000 [drp] | Freq: Every day | OPHTHALMIC | 5 refills | Status: DC | PRN

## 2022-11-26 NOTE — Patient Instructions (Addendum)
Allergic Rhinitis:  - Positive skin test 10/2022: trees, grasses, weeds, dust mite, mold - Avoidance measures discussed. - Use nasal saline rinses before nose sprays such as with Neilmed Sinus Rinse.  Use distilled water.   - Use Flonase 2 sprays each nostril daily. Aim upward and outward. - Use Azelastine 1-2 sprays each nostril twice daily as needed. Aim upward and outward. - Use Zyrtec 10 mg daily.  - For eyes, use Olopatadine or Ketotifen 1 eye drop daily as needed for itchy, watery eyes.  Available over the counter, if not covered by insurance.  - Consider allergy shots as long term control of your symptoms by teaching your immune system to be more tolerant of your allergy triggers   History of Allergic Reaction - Unknown trigger. For now we will watch this. - If it reoccurs, please write down what you ate.   ALLERGEN AVOIDANCE MEASURES   Dust Mites Use central air conditioning and heat; and change the filter monthly.  Pleated filters work better than mesh filters.  Electrostatic filters may also be used; wash the filter monthly.  Window air conditioners may be used, but do not clean the air as well as a central air conditioner.  Change or wash the filter monthly. Keep windows closed.  Do not use attic fans.   Encase the mattress, box springs and pillows with zippered, dust proof covers. Wash the bed linens in hot water weekly.   Remove carpet, especially from the bedroom. Remove stuffed animals, throw pillows, dust ruffles, heavy drapes and other items that collect dust from the bedroom. Do not use a humidifier.   Use wood, vinyl or leather furniture instead of cloth furniture in the bedroom. Keep the indoor humidity at 30 - 40%.  Monitor with a humidity gauge.  Molds - Indoor avoidance Use air conditioning to reduce indoor humidity.  Do not use a humidifier. Keep indoor humidity at 30 - 40%.  Use a dehumidifier if needed. In the bathroom use an exhaust fan or open a window  after showering.  Wipe down damp surfaces after showering.  Clean bathrooms with a mold-killing solution (diluted bleach, or products like Tilex, etc) at least once a month. In the kitchen use an exhaust fan to remove steam from cooking.  Throw away spoiled foods immediately, and empty garbage daily.  Empty water pans below self-defrosting refrigerators frequently. Vent the clothes dryer to the outside. Limit indoor houseplants; mold grows in the dirt.  No houseplants in the bedroom. Remove carpet from the bedroom. Encase the mattress and box springs with a zippered encasing.  Molds - Outdoor avoidance Avoid being outside when the grass is being mowed, or the ground is tilled. Avoid playing in leaves, pine straw, hay, etc.  Dead plant materials contain mold. Avoid going into barns or grain storage areas. Remove leaves, clippings and compost from around the home. Pollen Avoidance Pollen levels are highest during the mid-day and afternoon.  Consider this when planning outdoor activities. Avoid being outside when the grass is being mowed, or wear a mask if the pollen-allergic person must be the one to mow the grass. Keep the windows closed to keep pollen outside of the home. Use an air conditioner to filter the air. Take a shower, wash hair, and change clothing after working or playing outdoors during pollen season.

## 2022-11-26 NOTE — Progress Notes (Signed)
FOLLOW UP Date of Service/Encounter:  11/26/22   Subjective:  Gina Sullivan (DOB: 03/24/1986) is a 37 y.o. female who [facility]/2022 for follow up for allergy skin testing.  History obtained from: chart review and patient. Doing well, held anti histamines.   Past Medical History: Past Medical History:  Diagnosis Date   Anemia    Asthma    Cancer (HCC)    thyroid    Hx of partial thyroidectomy 01/17/2017   Hypocalcemia 01/20/2017   Papillary carcinoma (HCC)     Objective:  BP 110/70   Pulse 88   Temp 98.2 F (36.8 C)   Ht 5\' 4"  (1.626 m)   Wt 192 lb 1.9 oz (87.1 kg)   SpO2 98%   BMI 32.98 kg/m  Body mass index is 32.98 kg/m. Physical Exam: GEN: alert, well developed HEENT: clear conjunctiva, MMM HEART: regular rate  LUNGS: no coughing, unlabored respiration SKIN: no rashes or lesions   Skin Testing:  Skin prick testing was placed, which includes aeroallergens/foods, histamine control, and saline control.  Verbal consent was obtained prior to placing test.  Patient tolerated procedure well.  Allergy testing results were read and interpreted by myself, documented by clinical staff. Adequate positive and negative control.  Positive results to:  Results discussed with patient/family.  Airborne Adult Perc - 11/26/22 0800     Time Antigen Placed 1610    Allergen Manufacturer Waynette Buttery    Location Back    Number of Test 59    1. Control-Buffer 50% Glycerol Negative    2. Control-Histamine 1 mg/ml 3+    3. Albumin saline Negative    4. Bahia Negative    5. [place] 3+    6. Johnson 3+    7. Kentucky Blue 3+    8. Meadow Fescue 3+    9. Perennial Rye 3+    10. Sweet Vernal Negative    11. Timothy 3+    12. Cocklebur Negative    13. Burweed Marshelder Negative    14. Ragweed, short Negative    15. Ragweed, Giant Negative    16. Plantain,  English Negative    17. Lamb's Quarters 2+    18. Sheep Sorrell Negative    19.  Rough Pigweed Negative    20. Marsh Elder, Rough Negative    21. Mugwort, Common Negative    22. Ash mix Negative    23. Birch mix 3+    24. Beech American 3+    25. Box, Elder 3+    26. Cedar, red 2+    27. Cottonwood, Eastern 3+    28. Elm mix 3+    29. Hickory 3+    30. Maple mix 3+    31. Oak, Guinea-Bissau mix 3+    32. Pecan Pollen 3+    33. Pine mix Negative    34. Sycamore Eastern 2+    35. Walnut, Black Pollen 3+    36. Alternaria alternata Negative    37. Cladosporium Herbarum Negative    38. Aspergillus mix Negative    39. Penicillium mix Negative    40. Bipolaris sorokiniana (Helminthosporium) Negative    41. Drechslera spicifera (Curvularia) Negative    42. Mucor plumbeus Negative    43. Fusarium moniliforme Negative    44. Aureobasidium pullulans (pullulara) Negative    45. Rhizopus oryzae Negative    46. Botrytis cinera Negative    47. Epicoccum nigrum Negative    48.  Phoma betae Negative    49. Candida Albicans Negative    50. Trichophyton mentagrophytes Negative    51. Mite, D Farinae  5,000 AU/ml Negative    52. Mite, D Pteronyssinus  5,000 AU/ml Negative    53. Cat Hair 10,000 BAU/ml Negative    54.  Dog Epithelia Negative    55. Mixed Feathers Negative    56. Horse Epithelia Negative    57. Cockroach, German Negative    58. Mouse Negative    59. Tobacco Leaf Negative             Intradermal - 11/26/22 1000     Time Antigen Placed 0920    Allergen Manufacturer Waynette Buttery    Location Arm    Number of Test 11    Control Negative    Ragweed mix 3+    Mold 1 Negative    Mold 2 Negative    Mold 3 Negative    Mold 4 2+    Cat Negative    Dog Negative    Cockroach Negative    Mite mix 3+              Assessment:   1. Seasonal and perennial allergic rhinitis     Plan/Recommendations:  Allergic Rhinitis: - Due to turbinate hypertrophy, seasonal symptoms and unresponsive to OTC meds, performed skin testing to identify aeroallergen triggers.    - Positive skin test 10/2022: trees, grasses, weeds, dust mite, mold - Avoidance measures discussed. - Use nasal saline rinses before nose sprays such as with Neilmed Sinus Rinse.  Use distilled water.   - Use Flonase 2 sprays each nostril daily. Aim upward and outward. - Use Azelastine 1-2 sprays each nostril twice daily as needed. Aim upward and outward. - Use Zyrtec 10 mg daily.  - For eyes, use Olopatadine or Ketotifen 1 eye drop daily as needed for itchy, watery eyes.  Available over the counter, if not covered by insurance.  - Consider allergy shots as long term control of your symptoms by teaching your immune system to be more tolerant of your allergy triggers   History of Allergic Reaction - Unknown trigger. For now we will watch this. - If it reoccurs, please write down what you ate.   Return in about 6 weeks (around 01/07/2023).  Alesia Morin, MD [facility]

## 2023-01-03 ENCOUNTER — Other Ambulatory Visit (HOSPITAL_COMMUNITY): Payer: Self-pay

## 2023-01-28 ENCOUNTER — Other Ambulatory Visit: Payer: Self-pay

## 2023-01-28 ENCOUNTER — Encounter: Payer: Self-pay | Admitting: Internal Medicine

## 2023-01-28 ENCOUNTER — Ambulatory Visit (INDEPENDENT_AMBULATORY_CARE_PROVIDER_SITE_OTHER): Payer: 59 | Admitting: Internal Medicine

## 2023-01-28 VITALS — BP 104/68 | HR 74 | Temp 98.5°F | Resp 16 | Wt 191.1 lb

## 2023-01-28 DIAGNOSIS — T7840XD Allergy, unspecified, subsequent encounter: Secondary | ICD-10-CM | POA: Diagnosis not present

## 2023-01-28 DIAGNOSIS — J302 Other seasonal allergic rhinitis: Secondary | ICD-10-CM | POA: Diagnosis not present

## 2023-01-28 DIAGNOSIS — J3089 Other allergic rhinitis: Secondary | ICD-10-CM | POA: Diagnosis not present

## 2023-01-28 MED ORDER — AZELASTINE HCL 0.1 % NA SOLN: 1.0000 | Freq: Two times a day (BID) | NASAL | 5 refills | Status: DC | PRN

## 2023-01-28 MED ORDER — OLOPATADINE HCL 0.2 % OP SOLN: 1.0000 [drp] | Freq: Every day | OPHTHALMIC | 5 refills | Status: DC | PRN

## 2023-01-28 MED ORDER — IPRATROPIUM BROMIDE 0.06 % NA SOLN: 2.0000 | Freq: Three times a day (TID) | NASAL | 5 refills | Status: DC | PRN

## 2023-01-28 MED ORDER — CETIRIZINE HCL 10 MG PO TABS: 10.0000 mg | ORAL_TABLET | Freq: Every day | ORAL | 5 refills | Status: AC

## 2023-01-28 MED ORDER — FLUTICASONE PROPIONATE 50 MCG/ACT NA SUSP: 2.0000 | Freq: Every day | NASAL | 5 refills | Status: DC

## 2023-01-28 NOTE — Progress Notes (Signed)
FOLLOW UP Date of Service/Encounter:  01/28/23   Subjective:  Gina Sullivan (DOB: [birth year]) is a 37 y.o. female who [facility] on 01/28/2023 for follow up for allergic rhinoconjunctivitis and allergic reaction.   History obtained from: chart review and patient.  Last visit was on 11/22/2022 for allergic reaction and allergic rhinitis. She came back later for skin prick testing.  Unclear etiology for reaction but wasn't even sure if it was IgE mediated based on her symptoms. SPT positive to pollen, DM, mold, discussed Flonase/Azelastine/Zyrtec/Olopatadine.   She still has trouble with sinus issues with congestion, sneezing, drainage and itchy watery eyes.  She is using Flonase, Azelastine PRN, Zyrtec without much improvement.  She has noted some improvement with dust mite avoidance measures but not significant improvement.    No other allergic reactions since last visit.   Past Medical History: Past Medical History:  Diagnosis Date   Anemia    Asthma    Cancer (HCC)    thyroid    Hx of partial thyroidectomy 01/17/2017   Hypocalcemia 01/20/2017   Papillary carcinoma (HCC)     Objective:  BP 104/68   Pulse 74   Temp 98.5 F (36.9 C) (Temporal)   Resp 16   Wt 191 lb 1.6 oz (86.7 kg)   SpO2 98%   BMI 32.80 kg/m  Body mass index is 32.8 kg/m. Physical Exam: GEN: alert, well developed HEENT: clear conjunctiva, TM grey and translucent, nose with moderate inferior turbinate hypertrophy, pink nasal mucosa, clear rhinorrhea, no cobblestoning HEART: regular rate and rhythm, no murmur LUNGS: clear to auscultation bilaterally, no coughing, unlabored respiration SKIN: no rashes or lesions   Assessment:   1. Seasonal and perennial allergic rhinitis   2. Allergic reaction, subsequent encounter     Plan/Recommendations:   Allergic Rhinitis:  - Uncontrolled, discussed AIT.  She is interested in [place], she confirmed no cardiac disease, severe  respiratory disease, beta blocker use or history of autoimmune dx.  - Positive skin test 10/2022: trees, grasses, weeds, dust mite, mold - Avoidance measures discussed. - Use nasal saline rinses before nose sprays such as with Neilmed Sinus Rinse.  Use distilled water.   - Use Flonase 2 sprays each nostril daily. Aim upward and outward. - Use Azelastine 1-2 sprays each nostril twice daily as needed. Aim upward and outward. - Use Ipratropium 1-2 sprays each nostril three times a day as needed for runny nose.  Aim upward and outward.  - Use Zyrtec 10 mg daily.  - For eyes, use Olopatadine or Ketotifen 1 eye drop daily as needed for itchy, watery eyes.  Available over the counter, if not covered by insurance.  - Consider allergy shots as long term control of your symptoms by teaching your immune system to be more tolerant of your allergy triggers.  Call us back if you are interested in this and we will mix the vials.  She is thinking about [facility] immunotherapy   History of Allergic Reaction  Initially 10/2022 with throat itching, cough and loss of voice after eating at a [place]; loss of voice and sore throat then for 2 days after. No other symptoms.  - Unknown trigger. For now we will watch this. I am not sure if this was truly an allergic reaction or not as her symptoms persisted for 2 days.  - If it reoccurs, please write down what you ate and keep a diary.       Return in  about 4 months (around 05/31/2023).  Alesia Morin, MD [facility]

## 2023-01-28 NOTE — Patient Instructions (Addendum)
Allergic Rhinitis:  - Positive skin test 10/2022: trees, grasses, weeds, dust mite, mold - Avoidance measures discussed. - Use nasal saline rinses before nose sprays such as with Neilmed Sinus Rinse.  Use distilled water.   - Use Flonase 2 sprays each nostril daily. Aim upward and outward. - Use Azelastine 1-2 sprays each nostril twice daily as needed. Aim upward and outward. - Use Ipratropium 1-2 sprays each nostril three times a day as needed for runny nose.  Aim upward and outward.  - Use Zyrtec 10 mg daily.  - For eyes, use Olopatadine or Ketotifen 1 eye drop daily as needed for itchy, watery eyes.  Available over the counter, if not covered by insurance.  - Consider allergy shots as long term control of your symptoms by teaching your immune system to be more tolerant of your allergy triggers.  Call us back if you are interested in this and we will mix the vials.  She is thinking about [facility] immunotherapy   History of Allergic Reaction - Unknown trigger. For now we will watch this. - If it reoccurs, please write down what you ate.

## 2023-02-06 ENCOUNTER — Other Ambulatory Visit: Payer: Self-pay | Admitting: Physician Assistant

## 2023-02-06 DIAGNOSIS — Z01818 Encounter for other preprocedural examination: Secondary | ICD-10-CM

## 2023-02-10 ENCOUNTER — Ambulatory Visit
Admission: RE | Admit: 2023-02-10 | Discharge: 2023-02-10 | Disposition: A | Discharge status: Home / Self Care | Payer: 59 | Source: Ambulatory Visit | Attending: Physician Assistant | Admitting: Physician Assistant

## 2023-02-10 ENCOUNTER — Other Ambulatory Visit: Payer: Self-pay | Admitting: Physician Assistant

## 2023-02-10 DIAGNOSIS — Z01818 Encounter for other preprocedural examination: Secondary | ICD-10-CM

## 2023-02-12 ENCOUNTER — Other Ambulatory Visit: Payer: Self-pay | Admitting: Physician Assistant

## 2023-02-12 DIAGNOSIS — R928 Other abnormal and inconclusive findings on diagnostic imaging of breast: Secondary | ICD-10-CM

## 2023-02-17 ENCOUNTER — Ambulatory Visit: Admission: RE | Admit: 2023-02-17 | Payer: 59 | Source: Ambulatory Visit

## 2023-02-17 ENCOUNTER — Ambulatory Visit: Payer: 59

## 2023-02-17 DIAGNOSIS — R928 Other abnormal and inconclusive findings on diagnostic imaging of breast: Secondary | ICD-10-CM

## 2023-07-04 ENCOUNTER — Other Ambulatory Visit: Payer: Self-pay

## 2023-07-04 ENCOUNTER — Other Ambulatory Visit (HOSPITAL_COMMUNITY): Payer: Self-pay

## 2023-07-04 MED ORDER — ZEPBOUND 2.5 MG/0.5ML ~~LOC~~ SOAJ
2.5000 mg | SUBCUTANEOUS | 1 refills | Status: DC
  Filled 2023-07-04 (×2): qty 2, 28d supply, fill #0

## 2023-07-04 MED ORDER — METFORMIN HCL ER 500 MG PO TB24
500.0000 mg | ORAL_TABLET | Freq: Every day | ORAL | 5 refills | Status: DC
  Filled 2023-07-04: qty 30, 30d supply, fill #0
  Filled 2023-07-29 – 2023-08-13 (×2): qty 30, 30d supply, fill #1

## 2023-07-07 ENCOUNTER — Other Ambulatory Visit (HOSPITAL_COMMUNITY): Payer: Self-pay

## 2023-07-07 MED ORDER — WEGOVY 0.25 MG/0.5ML ~~LOC~~ SOAJ
0.2500 mg | SUBCUTANEOUS | 0 refills | Status: DC
  Filled 2023-07-07 – 2023-07-08 (×2): qty 2, 28d supply, fill #0

## 2023-07-08 ENCOUNTER — Other Ambulatory Visit (HOSPITAL_COMMUNITY): Payer: Self-pay

## 2023-07-09 ENCOUNTER — Other Ambulatory Visit (HOSPITAL_COMMUNITY): Payer: Self-pay

## 2023-07-17 ENCOUNTER — Other Ambulatory Visit (HOSPITAL_COMMUNITY): Payer: Self-pay

## 2023-07-29 ENCOUNTER — Other Ambulatory Visit (HOSPITAL_COMMUNITY): Payer: Self-pay

## 2023-07-29 ENCOUNTER — Other Ambulatory Visit: Payer: Self-pay

## 2023-07-29 MED ORDER — WEGOVY 0.25 MG/0.5ML ~~LOC~~ SOAJ
0.2500 mg | SUBCUTANEOUS | 6 refills | Status: DC
  Filled 2023-07-29 – 2023-08-21 (×2): qty 2, 28d supply, fill #0

## 2023-07-31 ENCOUNTER — Other Ambulatory Visit (HOSPITAL_COMMUNITY): Payer: Self-pay

## 2023-07-31 ENCOUNTER — Encounter (HOSPITAL_COMMUNITY): Payer: Self-pay

## 2023-08-06 ENCOUNTER — Other Ambulatory Visit (HOSPITAL_COMMUNITY): Payer: Self-pay

## 2023-08-08 ENCOUNTER — Other Ambulatory Visit (HOSPITAL_COMMUNITY): Payer: Self-pay

## 2023-08-12 ENCOUNTER — Other Ambulatory Visit (HOSPITAL_COMMUNITY): Payer: Self-pay

## 2023-08-13 ENCOUNTER — Other Ambulatory Visit (HOSPITAL_COMMUNITY): Payer: Self-pay

## 2023-08-13 MED ORDER — ALPRAZOLAM 0.25 MG PO TABS
0.2500 mg | ORAL_TABLET | Freq: Every day | ORAL | 0 refills | Status: DC | PRN
  Filled 2023-08-13: qty 30, 30d supply, fill #0

## 2023-08-15 ENCOUNTER — Other Ambulatory Visit (HOSPITAL_COMMUNITY): Payer: Self-pay

## 2023-08-18 ENCOUNTER — Other Ambulatory Visit (HOSPITAL_COMMUNITY): Payer: Self-pay

## 2023-08-18 MED ORDER — WEGOVY 0.5 MG/0.5ML ~~LOC~~ SOAJ
0.5000 mg | SUBCUTANEOUS | 5 refills | Status: DC
  Filled 2023-08-18: qty 2, 28d supply, fill #0

## 2023-08-19 ENCOUNTER — Other Ambulatory Visit (HOSPITAL_COMMUNITY): Payer: Self-pay

## 2023-08-21 ENCOUNTER — Other Ambulatory Visit (HOSPITAL_COMMUNITY): Payer: Self-pay

## 2023-09-02 ENCOUNTER — Other Ambulatory Visit (HOSPITAL_COMMUNITY): Payer: Self-pay

## 2023-09-11 ENCOUNTER — Other Ambulatory Visit (HOSPITAL_COMMUNITY): Payer: Self-pay

## 2023-09-11 MED ORDER — HYDROXYZINE HCL 25 MG PO TABS
25.0000 mg | ORAL_TABLET | Freq: Every evening | ORAL | 0 refills | Status: DC | PRN
  Filled 2023-09-11: qty 60, 30d supply, fill #0

## 2023-09-19 ENCOUNTER — Telehealth: Payer: Self-pay | Admitting: Internal Medicine

## 2023-09-19 NOTE — Telephone Encounter (Signed)
Pt was wondering about starting injections was able to give her cpt codes for the injections and she is going to call her insurance and let us know if it is something feasible she can do

## 2023-09-19 NOTE — Telephone Encounter (Signed)
Patient called and wanted to know about a testing she needs to get done and requested a call back at [phone].

## 2023-09-19 NOTE — Telephone Encounter (Signed)
Lm for pt to call us back about this °

## 2023-10-07 ENCOUNTER — Encounter: Payer: Self-pay | Admitting: Obstetrics and Gynecology

## 2023-10-07 ENCOUNTER — Other Ambulatory Visit (HOSPITAL_COMMUNITY): Payer: Self-pay

## 2023-10-07 ENCOUNTER — Ambulatory Visit (INDEPENDENT_AMBULATORY_CARE_PROVIDER_SITE_OTHER): Payer: PRIVATE HEALTH INSURANCE | Admitting: Obstetrics and Gynecology

## 2023-10-07 VITALS — BP 98/62 | HR 85 | Ht 65.5 in | Wt 172.0 lb

## 2023-10-07 DIAGNOSIS — F341 Dysthymic disorder: Secondary | ICD-10-CM | POA: Diagnosis not present

## 2023-10-07 DIAGNOSIS — F520 Hypoactive sexual desire disorder: Secondary | ICD-10-CM | POA: Insufficient documentation

## 2023-10-07 DIAGNOSIS — Z01419 Encounter for gynecological examination (general) (routine) without abnormal findings: Secondary | ICD-10-CM | POA: Insufficient documentation

## 2023-10-07 DIAGNOSIS — Z1331 Encounter for screening for depression: Secondary | ICD-10-CM | POA: Diagnosis not present

## 2023-10-07 MED ORDER — ONDANSETRON 8 MG PO TBDP
8.0000 mg | ORAL_TABLET | Freq: Two times a day (BID) | ORAL | 1 refills | Status: DC | PRN
  Filled 2023-10-07: qty 30, 15d supply, fill #0

## 2023-10-07 MED ORDER — VYLEESI 1.75 MG/0.3ML ~~LOC~~ SOAJ: 0.3000 mL | Freq: Once | SUBCUTANEOUS | 11 refills | Status: DC | PRN

## 2023-10-07 MED ORDER — BUPROPION HCL ER (XL) 150 MG PO TB24
ORAL_TABLET | ORAL | 0 refills | Status: DC
  Filled 2023-10-07: qty 173, 90d supply, fill #0

## 2023-10-07 NOTE — Progress Notes (Signed)
 38 y.o. G64P1011 female status post bilateral breast reduction (2024) here for annual exam. Married. IVF pregnancy, daughter is 31 yr. Optician. Patient with Hypothyroidism on Synthroid 0.175 microgram daily because of H/O Thyroid Ca 5 yrs ago, s/p Thyroidectomy, followed by Dr Talmage Nap.    Reports low sex drive, energy, and motivation to do things. Has been dealing with these symptoms for years. PHQ-9: 10 No longer breastfeeding.  Patient's last menstrual period was 09/17/2023 (approximate). Period Duration (Days): 4 Period Pattern: Regular Menstrual Flow: Light Menstrual Control: Maxi pad Dysmenorrhea: (!) Moderate Dysmenorrhea Symptoms: Headache  Abnormal bleeding: none Pelvic discharge or pain: none Breast mass, nipple discharge or skin changes : none Birth control: none Last PAP:     Component Value Date/Time   DIAGPAP  10/03/2022 1431    - Negative for intraepithelial lesion or malignancy (NILM)   DIAGPAP  07/12/2021 0837    - Negative for intraepithelial lesion or malignancy (NILM)   DIAGPAP  07/07/2019 0000    - Negative for Intraepithelial Lesions or Malignancy (NILM)   DIAGPAP - Benign reactive/reparative changes 07/07/2019 0000   HPVHIGH Negative 10/03/2022 1431   HPVHIGH Negative 07/07/2019 0000   ADEQPAP  10/03/2022 1431    Satisfactory for evaluation; transformation zone component PRESENT.   ADEQPAP  07/12/2021 0837    Satisfactory for evaluation; transformation zone component PRESENT.   ADEQPAP  07/07/2019 0000    Satisfactory for evaluation; transformation zone component PRESENT.   MMG: 02/17/2023 BiRADS 2, density b Sexually active: yes  Exercising: yes, walk Smoker: no  GYN HISTORY: No significant history  OB History  Gravida Para Term Preterm AB Living  2 1 1  1 1   SAB IAB Ectopic Multiple Live Births  1   0 1    # Outcome Date GA Lbr Len/2nd Weight Sex Type Anes PTL Lv  2 Term 06/20/22 [redacted]w[redacted]d  8 lb 5.3 oz (3.78 kg) F CS-LTranv EPI  LIV      Birth Comments: none  1 SAB             Past Medical History:  Diagnosis Date   Anemia    Asthma    Cancer (HCC)    thyroid    Hx of partial thyroidectomy 01/17/2017   Hypocalcemia 01/20/2017   Papillary carcinoma (HCC)     Past Surgical History:  Procedure Laterality Date   CESAREAN SECTION N/A 06/20/2022   Procedure: CESAREAN SECTION;  Surgeon: Huel Cote, MD;  Location: [facility];  Service: Obstetrics;  Laterality: N/A;   THYROIDECTOMY  01/17/2017   THYROIDECTOMY Right 01/17/2017   Procedure: THYROIDECTOMY;  Surgeon: Christia Reading, MD;  Location: [facility];  Service: ENT;  Laterality: Right;  total thyroidectomy   THYROIDECTOMY      Current Outpatient Medications on File Prior to Visit  Medication Sig Dispense Refill   cetirizine (ZYRTEC ALLERGY) 10 MG tablet Take 1 tablet (10 mg total) by mouth daily. 30 tablet 5   levothyroxine (SYNTHROID) 150 MCG tablet Take 150 mcg by mouth daily.     No current facility-administered medications on file prior to visit.    Social History   Socioeconomic History   Marital status: Married    Spouse name: Not on file   Number of children: Not on file   Years of education: Not on file   Highest education level: Not on file  Occupational History   Not on file  Tobacco Use   Smoking status: Never    Passive  exposure: Never   Smokeless tobacco: Never  Vaping Use   Vaping status: Never Used  Substance and Sexual Activity   Alcohol use: Not Currently   Drug use: Never   Sexual activity: Yes    Partners: Male    Birth control/protection: None  Other Topics Concern   Not on file  Social History Narrative   ** Merged History Encounter **       Social Drivers of Health   Financial Resource Strain: Not on file  Food Insecurity: Unknown (06/19/2022)   Hunger Vital Sign    Worried About Running Out of Food in the Last Year: Never true    Ran Out of Food in the Last Year: Not on file  Transportation Needs: No Transportation  Needs (06/19/2022)   PRAPARE - Administrator, Civil Service (Medical): No    Lack of Transportation (Non-Medical): No  Physical Activity: Not on file  Stress: Not on file  Social Connections: Not on file  Intimate Partner Violence: Not At Risk (06/19/2022)   Humiliation, Afraid, Rape, and Kick questionnaire    Fear of Current or Ex-Partner: No    Emotionally Abused: No    Physically Abused: No    Sexually Abused: No    Family History  Problem Relation Age of Onset   Hypertension Mother    Hypertension Father     Allergies  Allergen Reactions   Niacin And Related Hives      PE Today's Vitals   10/07/23 0748  BP: 98/62  Pulse: 85  TempSrc: Oral  SpO2: 91%  Weight: 172 lb (78 kg)  Height: 5' 5.5" (1.664 m)   Body mass index is 28.19 kg/m.  Physical Exam Vitals reviewed. Exam conducted with a chaperone present.  Constitutional:      General: She is not in acute distress.    Appearance: Normal appearance.  HENT:     Head: Normocephalic and atraumatic.     Nose: Nose normal.  Eyes:     Extraocular Movements: Extraocular movements intact.     Conjunctiva/sclera: Conjunctivae normal.  Neck:     Thyroid: No thyroid mass, thyromegaly or thyroid tenderness.  Pulmonary:     Effort: Pulmonary effort is normal.  Chest:     Chest wall: No mass or tenderness.  Breasts:    Right: Normal. No swelling, mass, nipple discharge, skin change or tenderness.     Left: Normal. No swelling, mass, nipple discharge, skin change or tenderness.     Comments: Bilateral mammoplasty scars Abdominal:     General: There is no distension.     Palpations: Abdomen is soft.     Tenderness: There is no abdominal tenderness.  Genitourinary:    General: Normal vulva.     Exam position: Lithotomy position.     Urethra: No prolapse.     Vagina: Normal. No vaginal discharge or bleeding.     Cervix: Normal. No lesion.     Uterus: Normal. Not enlarged and not tender.      Adnexa:  Right adnexa normal and left adnexa normal.  Musculoskeletal:        General: Normal range of motion.     Cervical back: Normal range of motion.  Lymphadenopathy:     Upper Body:     Right upper body: No axillary adenopathy.     Left upper body: No axillary adenopathy.     Lower Body: No right inguinal adenopathy. No left inguinal adenopathy.  Skin:  General: Skin is warm and dry.  Neurological:     General: No focal deficit present.     Mental Status: She is alert.  Psychiatric:        Mood and Affect: Mood normal.        Behavior: Behavior normal.       Assessment and Plan:        Well woman exam with routine gynecological exam Assessment & Plan: Cervical cancer screening performed according to ASCCP guidelines. Labs and immunizations with her primary Encouraged safe sexual practices as indicated Encouraged healthy lifestyle practices with diet and exercise For patients under 50yo, I recommend 1000mg  calcium daily and 600IU of vitamin D daily.    Persistent depressive disorder Assessment & Plan: Reviewed can have side effects such as mood irritability, GI upset, weight gain, sexual dysfunction, and drowsiness. Suicidal ideation can happpen in a subset of patient, and we reviewed that the medication would need to be stopped for this reason. Return office in 3 months  Orders: -     buPROPion HCl ER (XL); Take 1 tablet (150 mg total) by mouth daily for 7 days, THEN 2 tablets (300 mg total) daily.  Dispense: 173 tablet; Refill: 0  Hypoactive sexual desire disorder Assessment & Plan: Discussed side effects of Vyleesi including nausea and flushing. Rx for Zofran provided. Reviewed as needed injectable medication for improved sexual function. Contraindications including hypertension and cardiovascular disease were reviewed. All questions answered.  Orders: -     Vyleesi; Inject 0.3 mLs into the skin once as needed for up to 1 dose. Use at least 45 minutes before  intercourse. Only use 1 injection in a 24hr window.  Dispense: 1.2 mL; Refill: 11 -     Ondansetron; Dissolve 1 tablet (8 mg total) in mouth 2 (two) times daily as needed for nausea or vomiting.  Dispense: 30 tablet; Refill: 1    Rosalyn Gess, MD

## 2023-10-07 NOTE — Assessment & Plan Note (Signed)
 Discussed side effects of Vyleesi including nausea and flushing. Rx for Zofran provided. Reviewed as needed injectable medication for improved sexual function. Contraindications including hypertension and cardiovascular disease were reviewed. All questions answered.

## 2023-10-07 NOTE — Assessment & Plan Note (Signed)
 Cervical cancer screening performed according to ASCCP guidelines. Labs and immunizations with her primary Encouraged safe sexual practices as indicated Encouraged healthy lifestyle practices with diet and exercise For patients under 36yo, I recommend 1000mg  calcium daily and 600IU of vitamin D daily.

## 2023-10-07 NOTE — Patient Instructions (Signed)

## 2023-10-07 NOTE — Assessment & Plan Note (Signed)
 Reviewed can have side effects such as mood irritability, GI upset, weight gain, sexual dysfunction, and drowsiness. Suicidal ideation can happpen in a subset of patient, and we reviewed that the medication would need to be stopped for this reason. Return office in 3 months

## 2023-10-10 ENCOUNTER — Telehealth: Payer: Self-pay

## 2023-10-10 NOTE — Telephone Encounter (Signed)
 Patient LM on RX line. Spoke to patient [phone]):  Patient states she was prescribed an injection at her last office visit [facility] and she desires oral medication instead. Last AEX:  10/07/23 Sent to provider for review.

## 2023-10-13 ENCOUNTER — Other Ambulatory Visit (HOSPITAL_COMMUNITY): Payer: Self-pay

## 2023-10-13 ENCOUNTER — Other Ambulatory Visit: Payer: Self-pay

## 2023-10-13 MED ORDER — ERYTHROMYCIN 2 % EX GEL
1.0000 | Freq: Two times a day (BID) | CUTANEOUS | 0 refills | Status: DC
  Filled 2023-10-13: qty 30, 15d supply, fill #0
  Filled 2023-10-13: qty 30, 30d supply, fill #0

## 2023-10-13 MED ORDER — MAXITROL 3.5-10000-0.1 OP OINT
TOPICAL_OINTMENT | Freq: Two times a day (BID) | OPHTHALMIC | 1 refills | Status: DC
  Filled 2023-10-13: qty 3.5, 7d supply, fill #0

## 2023-10-15 NOTE — Telephone Encounter (Signed)
 Following up on patient's phone call regarding changing Vyleesi to oral medication.

## 2023-10-21 NOTE — Telephone Encounter (Signed)
 Dr. Kennith Center -can you provide update on alternative medication?

## 2023-10-22 ENCOUNTER — Other Ambulatory Visit: Payer: Self-pay

## 2023-10-22 MED ORDER — ADDYI 100 MG PO TABS: 100.0000 mg | ORAL_TABLET | Freq: Every day | ORAL | 0 refills | Status: DC

## 2023-10-22 NOTE — Telephone Encounter (Signed)
 Hello, I need to send a message to my OBGYN, she prescribe a medication but she told me to contact her if I want to try the pills name addy, i think that the name.. if she can send it to my pharmacy will be great... Thnaks.

## 2023-10-22 NOTE — Telephone Encounter (Signed)
 Oral alternative for hypoactive sexual desire disorder would be Addyi.  Addyi requires daily dosing taken at bedtime.  Side effects include dizziness and syncope, and the medication cannot be taken with alcohol use.  If she has 2 alcoholic beverages, then she should avoid taking the medication for 2 hours.  If she has 3 or more alcoholic beverages, then she should skip her dosing for the night.  It can also cause nausea and abdominal pain.  She will need a follow-up appointment with labs within 2 months of starting the medication.  If she is amenable to starting this, then I will send a prescription.

## 2023-10-24 ENCOUNTER — Other Ambulatory Visit (HOSPITAL_COMMUNITY): Payer: Self-pay

## 2023-11-04 ENCOUNTER — Other Ambulatory Visit: Payer: Self-pay

## 2023-11-04 MED ORDER — ADDYI 100 MG PO TABS: 100.0000 mg | ORAL_TABLET | Freq: Every day | ORAL | 0 refills | Status: DC

## 2023-11-05 ENCOUNTER — Telehealth: Payer: Self-pay

## 2023-11-05 NOTE — Telephone Encounter (Signed)
 PA for Addyi 100 MG:  Your prior authorization was successfully sent to the prescriber ([employer]) on:  Wednesday, April 9th at 02:08pm  Pt is aware. Allow up to 72 hours for response.

## 2023-11-05 NOTE — Telephone Encounter (Signed)
 [email or web address] Key: [id number] [facility] is reviewing your PA request. Typically an electronic response will be received within 24-72 hours.

## 2023-11-06 ENCOUNTER — Other Ambulatory Visit (HOSPITAL_COMMUNITY): Payer: Self-pay

## 2023-11-06 MED ORDER — SPIRONOLACTONE 50 MG PO TABS
50.0000 mg | ORAL_TABLET | Freq: Two times a day (BID) | ORAL | 2 refills | Status: DC
  Filled 2023-11-06: qty 60, 30d supply, fill #0
  Filled 2023-12-02: qty 60, 30d supply, fill #1
  Filled 2024-02-03: qty 60, 30d supply, fill #2

## 2023-11-06 NOTE — Telephone Encounter (Signed)
 ADDYI TAB 100MG  is approved through 02/04/2024. Your patient may now fill this prescription and it will be covered.. Authorization Expiration Date: February 04, 2024.  Request Reference Number: [id number].  I spoke to patient and she is aware that the medication will be mailed to her.

## 2023-12-02 ENCOUNTER — Encounter: Payer: Self-pay | Admitting: Obstetrics and Gynecology

## 2023-12-03 ENCOUNTER — Telehealth: Payer: Self-pay

## 2023-12-03 NOTE — Telephone Encounter (Signed)
 Lupe from [facility] pharmacy called regarding a PA for this patient. She said to call back at [phone]

## 2023-12-10 ENCOUNTER — Encounter: Payer: Self-pay | Admitting: Obstetrics and Gynecology

## 2023-12-10 ENCOUNTER — Other Ambulatory Visit (HOSPITAL_COMMUNITY): Payer: Self-pay

## 2023-12-10 ENCOUNTER — Ambulatory Visit (INDEPENDENT_AMBULATORY_CARE_PROVIDER_SITE_OTHER): Admitting: Obstetrics and Gynecology

## 2023-12-10 VITALS — BP 112/70 | HR 126 | Temp 98.2°F | Wt 169.0 lb

## 2023-12-10 DIAGNOSIS — N898 Other specified noninflammatory disorders of vagina: Secondary | ICD-10-CM

## 2023-12-10 DIAGNOSIS — F520 Hypoactive sexual desire disorder: Secondary | ICD-10-CM

## 2023-12-10 DIAGNOSIS — F341 Dysthymic disorder: Secondary | ICD-10-CM

## 2023-12-10 DIAGNOSIS — B3731 Acute candidiasis of vulva and vagina: Secondary | ICD-10-CM | POA: Diagnosis not present

## 2023-12-10 DIAGNOSIS — N760 Acute vaginitis: Secondary | ICD-10-CM

## 2023-12-10 DIAGNOSIS — R300 Dysuria: Secondary | ICD-10-CM | POA: Diagnosis not present

## 2023-12-10 LAB — WET PREP FOR TRICH, YEAST, CLUE

## 2023-12-10 MED ORDER — FLUCONAZOLE 150 MG PO TABS
150.0000 mg | ORAL_TABLET | Freq: Once | ORAL | 2 refills | Status: AC
Start: 2023-12-10 — End: 2023-12-12
  Filled 2023-12-10 (×2): qty 1, 1d supply, fill #0

## 2023-12-10 MED ORDER — ADDYI 100 MG PO TABS
100.0000 mg | ORAL_TABLET | Freq: Every day | ORAL | 3 refills | Status: DC
  Filled 2023-12-10: qty 30, 30d supply, fill #0

## 2023-12-10 MED ORDER — BUPROPION HCL ER (XL) 300 MG PO TB24
300.0000 mg | ORAL_TABLET | Freq: Every day | ORAL | 3 refills | Status: AC
  Filled 2023-12-10: qty 90, 90d supply, fill #0
  Filled 2024-03-05: qty 90, 90d supply, fill #1
  Filled 2024-06-03: qty 90, 90d supply, fill #2

## 2023-12-10 MED ORDER — METRONIDAZOLE 0.75 % VA GEL
1.0000 | Freq: Every day | VAGINAL | 0 refills | Status: AC
Start: 2023-12-10 — End: 2023-12-16
  Filled 2023-12-10: qty 70, 7d supply, fill #0

## 2023-12-10 MED ORDER — ADDYI 100 MG PO TABS
100.0000 mg | ORAL_TABLET | Freq: Every day | ORAL | 3 refills | Status: AC
Start: 2023-12-10 — End: ?

## 2023-12-10 NOTE — Progress Notes (Signed)
 38 y.o. G60P1011 female status post bilateral breast reduction (2024) with depression, low libido here for problem visit. Married. IVF pregnancy, daughter is 31 yr. Optician. Patient with Hypothyroidism on Synthroid  0.175 microgram daily because of H/O Thyroid  Ca 5 yrs ago, s/p Thyroidectomy, followed by Dr Ronelle Coffee.    Vaginal burning and irritation - symptoms since Monday. Used monistat topical cream - only temporary relief. Some burning with urination.  At annual exam on 10/07/2023, she reported: Reports low sex drive, energy, and motivation to do things. Has been dealing with these symptoms for years. PHQ-9: 10 >3 today  She was started on Wellbutrin .  She feels her mood is doing much better.  She was also started on Addyi  for HSED and this has also improved her sex drive.  She is not having any pain or dryness with intercourse.  Patient's last menstrual period was 11/27/2023 (approximate). Period Duration (Days): 5 Period Pattern: Regular Menstrual Flow: Light Menstrual Control: Maxi pad Dysmenorrhea: (!) Mild Dysmenorrhea Symptoms: Headache  GYN HISTORY: No significant history  OB History  Gravida Para Term Preterm AB Living  2 1 1  1 1   SAB IAB Ectopic Multiple Live Births  1   0 1    # Outcome Date GA Lbr Len/2nd Weight Sex Type Anes PTL Lv  2 Term 06/20/22 [redacted]w[redacted]d  8 lb 5.3 oz (3.78 kg) F CS-LTranv EPI  LIV     Birth Comments: none  1 SAB             Past Medical History:  Diagnosis Date   Anemia    Asthma    Cancer (HCC)    thyroid     Hx of partial thyroidectomy 01/17/2017   Hypocalcemia 01/20/2017   Papillary carcinoma (HCC)     Past Surgical History:  Procedure Laterality Date   CESAREAN SECTION N/A 06/20/2022   Procedure: CESAREAN SECTION;  Surgeon: Rogene Claude, MD;  Location: [facility] ORS;  Service: Obstetrics;  Laterality: N/A;   THYROIDECTOMY  01/17/2017   THYROIDECTOMY Right 01/17/2017   Procedure: THYROIDECTOMY;  Surgeon: Virgina Grills, MD;   Location: [facility] OR;  Service: ENT;  Laterality: Right;  total thyroidectomy   THYROIDECTOMY      Current Outpatient Medications on File Prior to Visit  Medication Sig Dispense Refill   cetirizine  (ZYRTEC  ALLERGY ) 10 MG tablet Take 1 tablet (10 mg total) by mouth daily. 30 tablet 5   erythromycin  with ethanol (EMGEL ) 2 % gel Apply 1 Application topically 2 (two) times daily for 7 days. 30 g 0   levothyroxine  (SYNTHROID ) 150 MCG tablet Take 150 mcg by mouth daily.     neomycin -polymyxin-dexameth (MAXITROL ) 0.1 % OINT Apply to the left eye 2 (two) times daily as directed. 3.5 g 1   ondansetron  (ZOFRAN -ODT) 8 MG disintegrating tablet Dissolve 1 tablet (8 mg total) in mouth 2 (two) times daily as needed for nausea or vomiting. 30 tablet 1   spironolactone  (ALDACTONE ) 50 MG tablet Take 1 tablet (50 mg total) by mouth 2 (two) times daily. 60 tablet 2   Prenatal Vit-Fe Fumarate-FA (PRENATAL VITAMIN PO)      No current facility-administered medications on file prior to visit.    Social History   Socioeconomic History   Marital status: Married    Spouse name: Not on file   Number of children: Not on file   Years of education: Not on file   Highest education level: Not on file  Occupational History   Not on file  Tobacco Use   Smoking status: Never    Passive exposure: Never   Smokeless tobacco: Never  Vaping Use   Vaping status: Never Used  Substance and Sexual Activity   Alcohol use: Not Currently   Drug use: Never   Sexual activity: Yes    Partners: Male    Birth control/protection: None  Other Topics Concern   Not on file  Social History Narrative   ** Merged History Encounter **       Social Drivers of Health   Financial Resource Strain: Not on file  Food Insecurity: Unknown (06/19/2022)   Hunger Vital Sign    Worried About Running Out of Food in the Last Year: Never true    Ran Out of Food in the Last Year: Not on file  Transportation Needs: No Transportation Needs  (06/19/2022)   PRAPARE - Administrator, Civil Service (Medical): No    Lack of Transportation (Non-Medical): No  Physical Activity: Not on file  Stress: Not on file  Social Connections: Not on file  Intimate Partner Violence: Not At Risk (06/19/2022)   Humiliation, Afraid, Rape, and Kick questionnaire    Fear of Current or Ex-Partner: No    Emotionally Abused: No    Physically Abused: No    Sexually Abused: No    Family History  Problem Relation Age of Onset   Hypertension Mother    Hypertension Father     Allergies  Allergen Reactions   Niacin And Related Hives      PE Today's Vitals   12/10/23 0954  BP: 112/70  Pulse: (!) 126  Temp: 98.2 F (36.8 C)  TempSrc: Oral  SpO2: 94%  Weight: 169 lb (76.7 kg)   Body mass index is 27.7 kg/m.  Physical Exam Vitals reviewed. Exam conducted with a chaperone present.  Constitutional:      General: She is not in acute distress.    Appearance: Normal appearance.  HENT:     Head: Normocephalic and atraumatic.     Nose: Nose normal.  Eyes:     Extraocular Movements: Extraocular movements intact.     Conjunctiva/sclera: Conjunctivae normal.  Pulmonary:     Effort: Pulmonary effort is normal.  Genitourinary:    General: Normal vulva.     Exam position: Lithotomy position.     Vagina: Vaginal discharge present.     Cervix: Normal. No cervical motion tenderness, discharge or lesion.     Uterus: Normal. Not enlarged and not tender.      Adnexa: Right adnexa normal and left adnexa normal.  Musculoskeletal:        General: Normal range of motion.     Cervical back: Normal range of motion.  Neurological:     General: No focal deficit present.     Mental Status: She is alert.  Psychiatric:        Mood and Affect: Mood normal.        Behavior: Behavior normal.      Assessment and Plan:        Burning with urination -     Urinalysis,Complete w/RFL Culture -     WET PREP FOR TRICH, YEAST, CLUE  Persistent  depressive disorder Assessment & Plan: Doing well, PHQ improved, continue tx RTO for annual  Orders: -     buPROPion  HCl ER (XL); Take 1 tablet (300 mg total) by mouth daily.  Dispense: 90 tablet; Refill: 3  Vaginal discharge -     WET PREP  FOR TRICH, YEAST, CLUE  Hypoactive sexual desire disorder -     Addyi ; Take 1 tablet (100 mg total) by mouth daily.  Dispense: 90 tablet; Refill: 3  Yeast vaginitis -     Fluconazole ; Take 1 tablet (150 mg total) by mouth once for 1 dose. May repeat dose in 48hr if needed.  Dispense: 1 tablet; Refill: 2  BV (bacterial vaginosis) -     metroNIDAZOLE; Place 1 Applicatorful vaginally at bedtime for 5 days.  Dispense: 50 g; Refill: 0   Romaine Closs, MD

## 2023-12-10 NOTE — Assessment & Plan Note (Signed)
 Doing well, PHQ improved, continue tx RTO for annual

## 2023-12-12 ENCOUNTER — Ambulatory Visit: Payer: Self-pay | Admitting: Obstetrics and Gynecology

## 2023-12-12 ENCOUNTER — Other Ambulatory Visit (HOSPITAL_COMMUNITY): Payer: Self-pay

## 2023-12-12 DIAGNOSIS — B9689 Other specified bacterial agents as the cause of diseases classified elsewhere: Secondary | ICD-10-CM

## 2023-12-12 DIAGNOSIS — N3000 Acute cystitis without hematuria: Secondary | ICD-10-CM

## 2023-12-12 LAB — URINE CULTURE
MICRO NUMBER:: 16454406
SPECIMEN QUALITY:: ADEQUATE

## 2023-12-12 LAB — URINALYSIS, COMPLETE W/RFL CULTURE
Bilirubin Urine: NEGATIVE
Glucose, UA: NEGATIVE
Hgb urine dipstick: NEGATIVE
Hyaline Cast: NONE SEEN /LPF
Ketones, ur: NEGATIVE
Nitrites, Initial: NEGATIVE
Protein, ur: NEGATIVE
RBC / HPF: NONE SEEN /HPF (ref 0–2)
Specific Gravity, Urine: 1.015 (ref 1.001–1.035)
pH: 6 (ref 5.0–8.0)

## 2023-12-12 LAB — CULTURE INDICATED

## 2023-12-12 MED ORDER — AMOXICILLIN-POT CLAVULANATE 875-125 MG PO TABS
1.0000 | ORAL_TABLET | Freq: Two times a day (BID) | ORAL | 0 refills | Status: AC
  Filled 2023-12-12: qty 10, 5d supply, fill #0

## 2023-12-31 ENCOUNTER — Other Ambulatory Visit (HOSPITAL_COMMUNITY): Payer: Self-pay

## 2023-12-31 MED ORDER — NUVESSA 1.3 % VA GEL
1.0000 | Freq: Once | VAGINAL | 0 refills | Status: AC
Start: 2023-12-31 — End: 2023-12-31
  Filled 2023-12-31: qty 5, 1d supply, fill #0
  Filled 2023-12-31: qty 5, 5d supply, fill #0

## 2024-01-01 ENCOUNTER — Telehealth: Payer: Self-pay

## 2024-01-01 NOTE — Telephone Encounter (Signed)
 Key: [id number]  Submitted PA for Nuvessa  1.3% gel suppository today in [email or web address].  [facility] is reviewing your PA request. Typically an electronic response will be received within 24-72 hours.

## 2024-01-06 ENCOUNTER — Other Ambulatory Visit (HOSPITAL_COMMUNITY): Payer: Self-pay

## 2024-01-06 MED ORDER — HYDROQUINONE 4 % EX CREA
1.0000 | TOPICAL_CREAM | Freq: Every day | CUTANEOUS | 3 refills | Status: DC
  Filled 2024-01-06: qty 28.35, 30d supply, fill #0

## 2024-01-07 ENCOUNTER — Encounter: Payer: Self-pay | Admitting: Obstetrics and Gynecology

## 2024-01-07 ENCOUNTER — Ambulatory Visit (INDEPENDENT_AMBULATORY_CARE_PROVIDER_SITE_OTHER): Payer: PRIVATE HEALTH INSURANCE | Admitting: Obstetrics and Gynecology

## 2024-01-07 VITALS — BP 106/64 | HR 97 | Temp 99.0°F | Wt 175.0 lb

## 2024-01-07 DIAGNOSIS — N760 Acute vaginitis: Secondary | ICD-10-CM

## 2024-01-07 DIAGNOSIS — N898 Other specified noninflammatory disorders of vagina: Secondary | ICD-10-CM

## 2024-01-07 LAB — WET PREP FOR TRICH, YEAST, CLUE

## 2024-01-07 MED ORDER — METRONIDAZOLE 500 MG PO TABS: 500.0000 mg | ORAL_TABLET | Freq: Two times a day (BID) | ORAL | 0 refills | Status: AC

## 2024-01-07 NOTE — Progress Notes (Signed)
 38 y.o. G67P1011 female status post bilateral breast reduction (2024) with depression, low libido here for problem visit. Married. IVF pregnancy, daughter is 31 yr. Optician. Patient with Hypothyroidism on Synthroid  0.175 microgram daily because of H/O Thyroid  Ca 5 yrs ago, s/p Thyroidectomy, followed by Dr Ronelle Coffee.    At annual exam on 10/07/2023, she reported: Reports low sex drive, energy, and motivation to do things. Has been dealing with these symptoms for years. PHQ-9: 10 >3 at last appointment  She was started on Wellbutrin .  She feels her mood is doing much better.  She was also started on Addyi  for HSDD and this has also improved her sex drive.    Today, Pt c/o vaginal discharge with odor. Symptoms returned after intercourse.  Patient's last menstrual period was 01/07/2024 (exact date).    GYN HISTORY: No significant history  OB History  Gravida Para Term Preterm AB Living  2 1 1  1 1   SAB IAB Ectopic Multiple Live Births  1   0 1    # Outcome Date GA Lbr Len/2nd Weight Sex Type Anes PTL Lv  2 Term 06/20/22 [redacted]w[redacted]d  8 lb 5.3 oz (3.78 kg) F CS-LTranv EPI  LIV     Birth Comments: none  1 SAB             Past Medical History:  Diagnosis Date   Anemia    Asthma    Cancer (HCC)    thyroid     Hx of partial thyroidectomy 01/17/2017   Hypocalcemia 01/20/2017   Papillary carcinoma [facility])     Past Surgical History:  Procedure Laterality Date   CESAREAN SECTION N/A 06/20/2022   Procedure: CESAREAN SECTION;  Surgeon: Rogene Claude, MD;  Location: [facility] LD ORS;  Service: Obstetrics;  Laterality: N/A;   THYROIDECTOMY  01/17/2017   THYROIDECTOMY Right 01/17/2017   Procedure: THYROIDECTOMY;  Surgeon: Virgina Grills, MD;  Location: [facility] OR;  Service: ENT;  Laterality: Right;  total thyroidectomy   THYROIDECTOMY      Current Outpatient Medications on File Prior to Visit  Medication Sig Dispense Refill   buPROPion  (WELLBUTRIN  XL) 300 MG 24 hr tablet Take 1 tablet (300 mg total) by  mouth daily. 90 tablet 3   cetirizine  (ZYRTEC  ALLERGY ) 10 MG tablet Take 1 tablet (10 mg total) by mouth daily. 30 tablet 5   Flibanserin  (ADDYI ) 100 MG TABS Take 1 tablet (100 mg total) by mouth daily. 90 tablet 3   levothyroxine  (SYNTHROID ) 150 MCG tablet Take 150 mcg by mouth daily.     Prenatal Vit-Fe Fumarate-FA (PRENATAL VITAMIN PO)      spironolactone  (ALDACTONE ) 50 MG tablet Take 1 tablet (50 mg total) by mouth 2 (two) times daily. 60 tablet 2   No current facility-administered medications on file prior to visit.    Allergies  Allergen Reactions   Niacin And Related Hives      PE Today's Vitals   01/07/24 1503  BP: 106/64  Pulse: 97  Temp: 99 F (37.2 C)  TempSrc: Oral  SpO2: 99%  Weight: 175 lb (79.4 kg)   Body mass index is 28.68 kg/m.  Physical Exam Vitals reviewed. Exam conducted with a chaperone present.  Constitutional:      General: She is not in acute distress.    Appearance: Normal appearance.  HENT:     Head: Normocephalic and atraumatic.     Nose: Nose normal.  Eyes:     Extraocular Movements: Extraocular movements intact.  Conjunctiva/sclera: Conjunctivae normal.  Pulmonary:     Effort: Pulmonary effort is normal.  Genitourinary:    General: Normal vulva.     Exam position: Lithotomy position.     Vagina: No vaginal discharge.     Cervix: Normal. No cervical motion tenderness, discharge or lesion.     Uterus: Normal. Not enlarged and not tender.      Adnexa: Right adnexa normal and left adnexa normal.     Comments: Menses present Musculoskeletal:        General: Normal range of motion.     Cervical back: Normal range of motion.  Neurological:     General: No focal deficit present.     Mental Status: She is alert.  Psychiatric:        Mood and Affect: Mood normal.        Behavior: Behavior normal.      Assessment and Plan:        Vaginal odor -     WET PREP FOR TRICH, YEAST, CLUE  BV (bacterial vaginosis) -     metroNIDAZOLE ;  Take 1 tablet (500 mg total) by mouth 2 (two) times daily for 7 days.  Dispense: 14 tablet; Refill: 0  Discussed partner treatment may be indicated.  Information regarding treatment was provided to patient.  Recommend husband follow-up with PCP or health department.  Doing well on Addyi  and Wellbutrin , has refills until annual exam.  No further follow-up indicated.  Romaine Closs, MD

## 2024-01-08 ENCOUNTER — Telehealth: Payer: Self-pay

## 2024-01-08 NOTE — Telephone Encounter (Signed)
 A PA has been submitted on [email or web address] for ADDYI  100 MG tabs as of today.  Key: [id number]  [employer] is reviewing your PA request. Typically an electronic response will be received within 24-72 hours. To check for an update later, open this request from your dashboard.

## 2024-01-09 NOTE — Telephone Encounter (Signed)
 Denied on June 5 by [employer] 2017 NCPDP  Request Reference Number: [id number].   NUVESSA  GEL 1.3% is denied for not meeting the prior authorization requirement(s).  The requested medication and/or diagnosis are not a covered benefit and excluded from coverage in accordance with the terms and conditions of your plan benefit. Therefore, the request has been administratively denied.  If applicable, below are some alternative medications to consider:  METRONIDAZOLE  VAGINAL  Please advise.

## 2024-01-09 NOTE — Telephone Encounter (Signed)
 Received Prior Authorization response:  Gina Sullivan (Key: [id number]) - [id number] Addyi  100MG  tablets status: PA Response - Approved Created: June 10th, 2025 Sent: June 12th, 2025  Pt is aware.

## 2024-02-03 ENCOUNTER — Other Ambulatory Visit (HOSPITAL_COMMUNITY): Payer: Self-pay

## 2024-03-05 ENCOUNTER — Other Ambulatory Visit (HOSPITAL_COMMUNITY): Payer: Self-pay

## 2024-03-05 ENCOUNTER — Other Ambulatory Visit: Payer: Self-pay

## 2024-03-10 ENCOUNTER — Other Ambulatory Visit (HOSPITAL_COMMUNITY): Payer: Self-pay

## 2024-03-11 ENCOUNTER — Other Ambulatory Visit (HOSPITAL_COMMUNITY): Payer: Self-pay

## 2024-03-11 MED ORDER — SPIRONOLACTONE 50 MG PO TABS
50.0000 mg | ORAL_TABLET | Freq: Two times a day (BID) | ORAL | 2 refills | Status: AC
  Filled 2024-03-11: qty 60, 30d supply, fill #0

## 2024-04-09 ENCOUNTER — Other Ambulatory Visit: Payer: Self-pay

## 2024-04-09 ENCOUNTER — Other Ambulatory Visit (HOSPITAL_COMMUNITY): Payer: Self-pay

## 2024-04-09 MED ORDER — SPIRONOLACTONE 50 MG PO TABS
50.0000 mg | ORAL_TABLET | Freq: Two times a day (BID) | ORAL | 2 refills | Status: AC
  Filled 2024-04-09: qty 60, 30d supply, fill #0

## 2024-04-14 ENCOUNTER — Other Ambulatory Visit (HOSPITAL_COMMUNITY): Payer: Self-pay

## 2024-04-16 ENCOUNTER — Other Ambulatory Visit (HOSPITAL_COMMUNITY): Payer: Self-pay

## 2024-04-16 ENCOUNTER — Encounter: Payer: Self-pay | Admitting: Obstetrics and Gynecology

## 2024-04-16 MED ORDER — MELOXICAM 15 MG PO TABS
15.0000 mg | ORAL_TABLET | Freq: Every day | ORAL | 0 refills | Status: DC
  Filled 2024-04-16: qty 30, 30d supply, fill #0

## 2024-05-05 ENCOUNTER — Other Ambulatory Visit (HOSPITAL_BASED_OUTPATIENT_CLINIC_OR_DEPARTMENT_OTHER): Payer: Self-pay

## 2024-05-05 MED ORDER — METHOCARBAMOL 500 MG PO TABS
500.0000 mg | ORAL_TABLET | Freq: Three times a day (TID) | ORAL | 0 refills | Status: AC
  Filled 2024-05-05 – 2024-05-06 (×2): qty 15, 5d supply, fill #0

## 2024-05-06 ENCOUNTER — Other Ambulatory Visit (HOSPITAL_BASED_OUTPATIENT_CLINIC_OR_DEPARTMENT_OTHER): Payer: Self-pay

## 2024-05-06 ENCOUNTER — Other Ambulatory Visit (HOSPITAL_COMMUNITY): Payer: Self-pay

## 2024-06-03 ENCOUNTER — Other Ambulatory Visit (HOSPITAL_COMMUNITY): Payer: Self-pay

## 2024-07-08 ENCOUNTER — Other Ambulatory Visit (HOSPITAL_COMMUNITY): Payer: Self-pay

## 2024-07-08 MED FILL — Meloxicam Tab 15 MG: 15.0000 mg | ORAL | 30 days supply | Qty: 30 | Fill #0 | Status: AC

## 2024-10-11 ENCOUNTER — Ambulatory Visit: Payer: PRIVATE HEALTH INSURANCE | Admitting: Obstetrics and Gynecology
